# Patient Record
Sex: Male | Born: 1990 | Race: White | Hispanic: No | Marital: Married | Smoking: Never smoker
Health system: Southern US, Community
[De-identification: ages and names within clinical notes are randomized; demographics above are authoritative.]

## PROBLEM LIST (undated history)

## (undated) ENCOUNTER — Emergency Department (HOSPITAL_COMMUNITY): Admission: EM | Disposition: A | Discharge status: Home / Self Care | Payer: Self-pay

## (undated) DIAGNOSIS — N200 Calculus of kidney: Secondary | ICD-10-CM

## (undated) DIAGNOSIS — E079 Disorder of thyroid, unspecified: Secondary | ICD-10-CM

---
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
## 2009-08-17 ENCOUNTER — Emergency Department (HOSPITAL_COMMUNITY): Admission: EM | Admit: 2009-08-17 | Discharge: 2009-08-17 | Payer: Self-pay | Admitting: Family Medicine

## 2011-05-03 NOTE — Op Note (Signed)
[facility]  Patient:    Brian Chavez, Brian Chavez Visit Number: [id number] MRN: [id number]          Service Type: DSU Location: [facility] Attending Physician:  Carlos Levering Dictated by:   Hyman Bible Pendse, M.D. Proc. Date: 02/04/02 Admit Date:  02/04/2002 Discharge Date: 02/04/2002   CC:         Elvina Sidle, M.D.   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  POSTOPERATIVE DIAGNOSIS: 1. Left undescended testicle. 2. Left atrophic testicle, grossly normal in shape and consistency.  OPERATION PERFORMED:  Exploration of left groin and left orchiopexy.  SURGEON:  Prabhakar D. Levie Heritage, M.D.  ASSISTANT: Nelida Meuse, M.D.  ANESTHESIA:  Nurse.  OPERATIVE FINDINGS: Exploration of left groin showed partially descended left testicle located in the distal aspect of the left inguinal canal and by inspection appeared to be atrophic; however, normal in shape and consistency without any gross abnormalities to warrant biopsy of the testicle.  There was no evidence of hernia.  DESCRIPTION OF PROCEDURE:  Under satisfactory general anesthesia, patient in supine position, the abdomen and groin regions were thoroughly prepped and draped in the usual manner.  About 3 inch long transverse incision was made in the left groin and distal skin crease.  The skin and subcutaneous tissues were incised.  There was a large pad of fat in the left groin area.  The external abdominal ring identified.  External oblique opened.  The testicle was located in the distal aspect of the inguinal canal which was elevated.  Abnormal attachments were clamped, cut and electrocoagulated.  Testicle was now inspected.  The findings were as described above.  Spermatic cord structures were mobilized in order to perform the orchiopexy.  The spermatic cord structures were dissected.  There was no evidence of hernia  noted.  At this time the left inguinal tunnel as well as left scrotal subcutaneous pouch were created.  Testicle was brought through the inguinal canal into the left scrotal subcutaneous pouch.  It was fixed to the scrotal fascia with 4-0 Vicryl interrupted sutures.  Having placed the testicle into the left scrotal subcutaneous pouch, scrotal skin was closed with 5-0 chromic interrupted sutures.  The inguinal canal was irrigated.  Inguinal canal was repaired by modified Fergusons method with #35 wire interrupted sutures.  0.25% Marcaine with epinephrine was injected locally for postoperative analgesia. Subcutaneous tissues apposed with 4-0 Vicryl, skin closed with 4-0 Monocryl subcuticular sutures.  Steri-Strips applied.  Throughout the procedure, the patients vital signs remained stable.  The patient withstood the procedure well and was transferred to the recovery room in satisfactory general condition. Dictated by:   Hyman Bible Pendse, M.D. Attending Physician:  Carlos Levering DD:  02/04/02 TD:  02/04/02 Job: [id number]

## 2014-06-28 ENCOUNTER — Encounter (HOSPITAL_COMMUNITY): Payer: Self-pay | Admitting: Emergency Medicine

## 2014-06-28 ENCOUNTER — Emergency Department (HOSPITAL_COMMUNITY): Payer: BC Managed Care – PPO

## 2014-06-28 ENCOUNTER — Emergency Department (HOSPITAL_COMMUNITY)
Admission: EM | Admit: 2014-06-28 | Discharge: 2014-06-28 | Disposition: A | Discharge status: Home / Self Care | Payer: BC Managed Care – PPO | Attending: Emergency Medicine | Admitting: Emergency Medicine

## 2014-06-28 DIAGNOSIS — Y9389 Activity, other specified: Secondary | ICD-10-CM | POA: Insufficient documentation

## 2014-06-28 DIAGNOSIS — S1093XA Contusion of unspecified part of neck, initial encounter: Secondary | ICD-10-CM

## 2014-06-28 DIAGNOSIS — Y929 Unspecified place or not applicable: Secondary | ICD-10-CM | POA: Insufficient documentation

## 2014-06-28 DIAGNOSIS — W208XXA Other cause of strike by thrown, projected or falling object, initial encounter: Secondary | ICD-10-CM | POA: Insufficient documentation

## 2014-06-28 DIAGNOSIS — S0003XA Contusion of scalp, initial encounter: Secondary | ICD-10-CM | POA: Insufficient documentation

## 2014-06-28 DIAGNOSIS — R21 Rash and other nonspecific skin eruption: Secondary | ICD-10-CM | POA: Insufficient documentation

## 2014-06-28 DIAGNOSIS — S0083XA Contusion of other part of head, initial encounter: Secondary | ICD-10-CM | POA: Insufficient documentation

## 2014-06-28 DIAGNOSIS — S0990XA Unspecified injury of head, initial encounter: Secondary | ICD-10-CM | POA: Insufficient documentation

## 2014-06-28 MED ORDER — OXYCODONE-ACETAMINOPHEN 5-325 MG PO TABS
1.0000 | ORAL_TABLET | Freq: Once | ORAL | Status: AC
  Administered 2014-06-28: 1 via ORAL
  Filled 2014-06-28: qty 1

## 2014-06-28 MED ORDER — PERMETHRIN 5 % EX CREA: TOPICAL_CREAM | CUTANEOUS | Status: DC
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
## 2014-06-28 NOTE — Discharge Instructions (Signed)
Ibuprofen or tylenol for headache. Rest. Follow up with primary care doctor for recheck. Apply hydrocortisone cream to the rash. Use permethrin cream if not improving.   Head Injury You have received a head injury. It does not appear serious at this time. Headaches and vomiting are common following head injury. It should be easy to awaken from sleeping. Sometimes it is necessary for you to stay in the emergency department for a while for observation. Sometimes admission to the hospital may be needed. After injuries such as yours, most problems occur within the first 24 hours, but side effects may occur up to 7-10 days after the injury. It is important for you to carefully monitor your condition and contact your health care provider or seek immediate medical care if there is a change in your condition. WHAT ARE THE TYPES OF HEAD INJURIES? Head injuries can be as minor as a bump. Some head injuries can be more severe. More severe head injuries include:  A jarring injury to the brain (concussion).  A bruise of the brain (contusion). This mean there is bleeding in the brain that can cause swelling.  A cracked skull (skull fracture).  Bleeding in the brain that collects, clots, and forms a bump (hematoma). WHAT CAUSES A HEAD INJURY? A serious head injury is most likely to happen to someone who is in a car wreck and is not wearing a seat belt. Other causes of major head injuries include bicycle or motorcycle accidents, sports injuries, and falls. HOW ARE HEAD INJURIES DIAGNOSED? A complete history of the event leading to the injury and your current symptoms will be helpful in diagnosing head injuries. Many times, pictures of the brain, such as CT or MRI are needed to see the extent of the injury. Often, an overnight hospital stay is necessary for observation.  WHEN SHOULD I SEEK IMMEDIATE MEDICAL CARE?  You should get help right away if:  You have confusion or drowsiness.  You feel sick to your  stomach (nauseous) or have continued, forceful vomiting.  You have dizziness or unsteadiness that is getting worse.  You have severe, continued headaches not relieved by medicine. Only take over-the-counter or prescription medicines for pain, fever, or discomfort as directed by your health care provider.  You do not have normal function of the arms or legs or are unable to walk.  You notice changes in the black spots in the center of the colored part of your eye (pupil).  You have a clear or bloody fluid coming from your nose or ears.  You have a loss of vision. During the next 24 hours after the injury, you must stay with someone who can watch you for the warning signs. This person should contact local emergency services (911 in the [place] if you have seizures, you become unconscious, or you are unable to wake up. HOW CAN I PREVENT A HEAD INJURY IN THE FUTURE? The most important factor for preventing major head injuries is avoiding motor vehicle accidents. To minimize the potential for damage to your head, it is crucial to wear seat belts while riding in motor vehicles. Wearing helmets while bike riding and playing collision sports (like football) is also helpful. Also, avoiding dangerous activities around the house will further help reduce your risk of head injury.  WHEN CAN I RETURN TO NORMAL ACTIVITIES AND ATHLETICS? You should be reevaluated by your health care provider before returning to these activities. If you have any of the following symptoms, you should not return  to activities or contact sports until 1 week after the symptoms have stopped:  Persistent headache.  Dizziness or vertigo.  Poor attention and concentration.  Confusion.  Memory problems.  Nausea or vomiting.  Fatigue or tire easily.  Irritability.  Intolerant of bright lights or loud noises.  Anxiety or depression.  Disturbed sleep. MAKE SURE YOU:   Understand these instructions.  Will watch your  condition.  Will get help right away if you are not doing well or get worse. Document Released: 12/02/2005 Document Revised: 12/07/2013 Document Reviewed: 08/09/2013 [facility] Patient Information 2015 [place], [place]. This information is not intended to replace advice given to you by your health care provider. Make sure you discuss any questions you have with your health care provider.

## 2014-06-28 NOTE — ED Notes (Signed)
Patient returned from CT
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
## 2014-06-30 NOTE — ED Provider Notes (Signed)
Medical screening examination/treatment/procedure(s) were performed by non-physician practitioner and as supervising physician I was immediately available for consultation/collaboration.   EKG Interpretation None       Raimundo Corbit, MD 06/30/14 0726 

## 2018-08-11 ENCOUNTER — Ambulatory Visit (HOSPITAL_COMMUNITY)
Admission: EM | Admit: 2018-08-11 | Discharge: 2018-08-11 | Disposition: A | Discharge status: Home / Self Care | Payer: BLUE CROSS/BLUE SHIELD | Attending: Family Medicine | Admitting: Family Medicine

## 2018-08-11 ENCOUNTER — Encounter (HOSPITAL_COMMUNITY): Payer: Self-pay

## 2018-08-11 DIAGNOSIS — Z87442 Personal history of urinary calculi: Secondary | ICD-10-CM | POA: Insufficient documentation

## 2018-08-11 DIAGNOSIS — R1011 Right upper quadrant pain: Secondary | ICD-10-CM | POA: Diagnosis present

## 2018-08-11 HISTORY — DX: Calculus of kidney: N20.0

## 2018-08-11 LAB — CBC
HCT: 47.2 % (ref 39.0–52.0)
Hemoglobin: 15.4 g/dL (ref 13.0–17.0)
MCH: 28.5 pg (ref 26.0–34.0)
MCHC: 32.6 g/dL (ref 30.0–36.0)
MCV: 87.2 fL (ref 78.0–100.0)
Platelets: 192 10*3/uL (ref 150–400)
RBC: 5.41 MIL/uL (ref 4.22–5.81)
RDW: 12.4 % (ref 11.5–15.5)
WBC: 9.4 10*3/uL (ref 4.0–10.5)

## 2018-08-11 LAB — COMPREHENSIVE METABOLIC PANEL
ALT: 31 U/L (ref 0–44)
AST: 20 U/L (ref 15–41)
Albumin: 4.3 g/dL (ref 3.5–5.0)
Alkaline Phosphatase: 35 U/L — ABNORMAL LOW (ref 38–126)
Anion gap: 11 (ref 5–15)
BUN: 11 mg/dL (ref 6–20)
CO2: 24 mmol/L (ref 22–32)
Calcium: 9.2 mg/dL (ref 8.9–10.3)
Chloride: 103 mmol/L (ref 98–111)
Creatinine, Ser: 0.91 mg/dL (ref 0.61–1.24)
GFR calc Af Amer: >60 mL/min (ref >60)
GFR calc non Af Amer: >60 mL/min (ref >60)
Glucose, Bld: 97 mg/dL (ref 70–99)
Potassium: 4 mmol/L (ref 3.5–5.1)
Sodium: 138 mmol/L (ref 135–145)
Total Bilirubin: 1.3 mg/dL — ABNORMAL HIGH (ref 0.3–1.2)
Total Protein: 7.5 g/dL (ref 6.5–8.1)

## 2018-08-11 LAB — LIPASE, BLOOD: Lipase: 34 U/L (ref 11–51)

## 2018-08-11 LAB — AMYLASE: Amylase: 17 U/L — ABNORMAL LOW (ref 28–100)

## 2018-08-11 NOTE — Discharge Instructions (Signed)
You have been seen today for abdominal pain. Your evaluation was not suggestive of any emergent condition requiring medical intervention at this time. However, some abdominal problems make take more time to appear. Therefore, it is important for you to watch for any new symptoms or worsening of your current condition. Please proceed to the Emergency Department immediately should you feel worse in any way or have any of the following symptoms: increasing or different abdominal pain, persistent vomiting, fevers, or shaking chills.  ° ° °

## 2018-08-11 NOTE — ED Triage Notes (Signed)
Pt presents with abdominal pain that is just above belly button that sometimes radiates around to right lower quadrant. Pt states the pain makes him nauseas.

## 2018-08-12 NOTE — ED Provider Notes (Signed)
[facility]   [id number] 08/11/18 Arrival Time: 1129  ASSESSMENT & PLAN:  1. Right upper quadrant abdominal pain    Still within the first 24 hours. Non-specific. Pain free here this evening. No significant lab abnormalities.   Discharge Instructions     You have been seen today for abdominal pain. Your evaluation was not suggestive of any emergent condition requiring medical intervention at this time. However, some abdominal problems make take more time to appear. Therefore, it is important for you to watch for any new symptoms or worsening of your current condition. Please proceed to the Emergency Department immediately should you feel worse in any way or have any of the following symptoms: increasing or different abdominal pain, persistent vomiting, fevers, or shaking chills.    Close observation.  Follow-up Information    [facility].   Specialty:  Emergency Medicine Why:  If symptoms worsen. Contact information: [address] [id number] [place] [id number] [phone]         May call his PCP to set up appt +/- RUQ U/S.  May f/u here as needed.  Reviewed expectations re: course of current medical issues. Questions answered. Outlined signs and symptoms indicating need for more acute intervention. Patient verbalized understanding. After Visit Summary given.   SUBJECTIVE:  Brian Chavez is a 27 y.o. male who presents with complaint of intermittent abdominal discomfort. Onset abrupt, yesterday. Location: midline upper abdomen and RUQ without radiation. Described as dull. Symptoms are intermittent since beginning. Typical episode with abrupt onset; lasts a few seconds; gradually resolves. Aggravating factors: none. Alleviating factors: none. Associated symptoms: none. He denies anorexia, belching, chills, constipation, diarrhea, fever, nausea, sweats and vomiting. No flank pain or hematuria or  dysuria. Appetite: normal. PO intake: normal. Ambulatory without assistance. Last bowel movement yesterday (he thinks) without blood. OTC treatment: none. Is passing gas from rectum "but not as much as usual".  History reviewed. No pertinent surgical history.  ROS: As per HPI. All other systems negative.  OBJECTIVE:  Vitals:   08/11/18 1149  BP: (!) 146/85  Pulse: 72  Resp: 20  Temp: 97.7 F (36.5 C)  TempSrc: Oral  SpO2: 96%    General appearance: alert; no distress Lungs: clear to auscultation bilaterally Heart: regular rate and rhythm Abdomen: soft; non-distended; no tenderness; bowel sounds present; no masses or organomegaly; no guarding or rebound tenderness Back: no CVA tenderness; FROM at hips Extremities: no edema; symmetrical with no gross deformities Skin: warm and dry Neurologic: normal gait Psychological: alert and cooperative; normal mood and affect  Labs: Results for orders placed or performed during the hospital encounter of 08/11/18  CBC  Result Value Ref Range   WBC 9.4 4.0 - 10.5 K/uL   RBC 5.41 4.22 - 5.81 MIL/uL   Hemoglobin 15.4 13.0 - 17.0 g/dL   HCT 64.447.2 03.439.0 - 74.252.0 %   MCV 87.2 78.0 - 100.0 fL   MCH 28.5 26.0 - 34.0 pg   MCHC 32.6 30.0 - 36.0 g/dL   RDW 59.512.4 63.811.5 - 75.615.5 %   Platelets 192 150 - 400 K/uL  Comprehensive metabolic panel  Result Value Ref Range   Sodium 138 135 - 145 mmol/L   Potassium 4.0 3.5 - 5.1 mmol/L   Chloride 103 98 - 111 mmol/L   CO2 24 22 - 32 mmol/L   Glucose, Bld 97 70 - 99 mg/dL   BUN 11 6 - 20 mg/dL   Creatinine, Ser 4.330.91  0.61 - 1.24 mg/dL   Calcium 9.2 8.9 - 40.9 mg/dL   Total Protein 7.5 6.5 - 8.1 g/dL   Albumin 4.3 3.5 - 5.0 g/dL   AST 20 15 - 41 U/L   ALT 31 0 - 44 U/L   Alkaline Phosphatase 35 (L) 38 - 126 U/L   Total Bilirubin 1.3 (H) 0.3 - 1.2 mg/dL   GFR calc non Af Amer >60 >60 mL/min   GFR calc Af Amer >60 >60 mL/min   Anion gap 11 5 - 15  Amylase  Result Value Ref Range   Amylase 17 (L) 28 -  100 U/L  Lipase, blood  Result Value Ref Range   Lipase 34 11 - 51 U/L     No Known Allergies                                             Past Medical History:  Diagnosis Date  . Kidney stones    Social History   Socioeconomic History  . Marital status: Married    Spouse name: Not on file  . Number of children: Not on file  . Years of education: Not on file  . Highest education level: Not on file  Occupational History  . Not on file  Social Needs  . Financial resource strain: Not on file  . Food insecurity:    Worry: Not on file    Inability: Not on file  . Transportation needs:    Medical: Not on file    Non-medical: Not on file  Tobacco Use  . Smoking status: Never Smoker  . Smokeless tobacco: Current User    Types: Chew  Substance and Sexual Activity  . Alcohol use: Yes    Comment: occasional   . Drug use: No  . Sexual activity: Not on file  Lifestyle  . Physical activity:    Days per week: Not on file    Minutes per session: Not on file  . Stress: Not on file  Relationships  . Social connections:    Talks on phone: Not on file    Gets together: Not on file    Attends religious service: Not on file    Active member of club or organization: Not on file    Attends meetings of clubs or organizations: Not on file    Relationship status: Not on file  . Intimate partner violence:    Fear of current or ex partner: Not on file    Emotionally abused: Not on file    Physically abused: Not on file    Forced sexual activity: Not on file  Other Topics Concern  . Not on file  Social History Narrative  . Not on file   FH: No h/o colon disease reported.   Mardella Layman, MD 08/12/18 [phone]

## 2021-05-01 ENCOUNTER — Emergency Department (HOSPITAL_COMMUNITY): Payer: 59

## 2021-05-01 ENCOUNTER — Other Ambulatory Visit: Payer: Self-pay

## 2021-05-01 ENCOUNTER — Emergency Department (HOSPITAL_COMMUNITY)
Admission: EM | Admit: 2021-05-01 | Discharge: 2021-05-01 | Disposition: A | Discharge status: Home / Self Care | Payer: 59 | Attending: Emergency Medicine | Admitting: Emergency Medicine

## 2021-05-01 ENCOUNTER — Encounter (HOSPITAL_COMMUNITY): Payer: Self-pay | Admitting: *Deleted

## 2021-05-01 DIAGNOSIS — M79662 Pain in left lower leg: Secondary | ICD-10-CM | POA: Diagnosis not present

## 2021-05-01 DIAGNOSIS — F17200 Nicotine dependence, unspecified, uncomplicated: Secondary | ICD-10-CM | POA: Insufficient documentation

## 2021-05-01 DIAGNOSIS — M79605 Pain in left leg: Secondary | ICD-10-CM | POA: Diagnosis present

## 2021-05-01 NOTE — ED Triage Notes (Signed)
Knot on left lower leg for over 2 years, referred here for ultrasound

## 2021-05-01 NOTE — Discharge Instructions (Signed)
I recommend a combination of tylenol and ibuprofen for management of your pain. You can take a low dose of both at the same time. I recommend 500 mg of Tylenol combined with 600 mg of ibuprofen. This is one maximum strength Tylenol and three regular ibuprofen. You can take these 2-3 times for day for your pain. Please try to take these medications with a small amount of food as well to prevent upsetting your stomach.  Also, please consider topical pain relieving creams such as Voltaran Gel, BioFreeze, or Icy Hot. There is also a pain relieving cream made by [employer]. You should be able to find all of these at your local pharmacy.   Below is a referral to Dr. Dallas Schimke with orthopedics.  Please give them a call in 1 to 2 weeks if you find your symptoms are still not improved.  If you develop any new or worsening symptoms, please return to the emergency department for reevaluation.  It was a pleasure to meet you.

## 2021-05-01 NOTE — ED Provider Notes (Signed)
[facility] EMERGENCY DEPARTMENT Provider Note   CSN: [id number] Arrival date & time: 05/01/21  1127     History Chief Complaint  Patient presents with  . Leg Pain    Brian Chavez is a 30 y.o. male.  HPI Patient is a 30 year old male who presents to the emergency department due to atraumatic intermittent pain along the left shin for the past 2 years.  Patient states that his pain worsens with ambulation and resolves with sitting still.  Denies any numbness or weakness.  States that he was seen in urgent care earlier today and told to come to the emergency department for DVT rule out.  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    Past Medical History:  Diagnosis Date  . Kidney stones     There are no problems to display for this patient.   History reviewed. No pertinent surgical history.     No family history on file.  Social History   Tobacco Use  . Smoking status: Never Smoker  . Smokeless tobacco: Current User    Types: Chew  Substance Use Topics  . Alcohol use: Yes    Comment: occasional   . Drug use: No    Home Medications Prior to Admission medications   Medication Sig Start Date End Date Taking? Authorizing Provider  ibuprofen (ADVIL,MOTRIN) 200 MG tablet Take 800 mg by mouth daily as needed for headache.    [provider]  permethrin (ELIMITE) 5 % cream Apply to affected area once. Repeat in one week 06/28/14   Jaynie Crumble, PA-C    Allergies    Patient has no known allergies.  Review of Systems   Review of Systems  Cardiovascular: Negative for leg swelling.  Musculoskeletal: Positive for myalgias.  Skin: Negative for color change and wound.  Neurological: Negative for weakness and numbness.   Physical Exam Updated Vital Signs BP 139/82   Pulse (!) 57   Temp 97.6 F (36.4 C) (Oral)   Resp 18   Ht 6\' 2"  (1.88 m)   Wt (!) 161.9 kg   SpO2 94%   BMI 45.84 kg/m    Physical Exam Vitals and nursing note reviewed.  Constitutional:      General: He is not in acute distress.    Appearance: He is well-developed.  HENT:     Head: Normocephalic and atraumatic.     Right Ear: External ear normal.     Left Ear: External ear normal.  Eyes:     General: No scleral icterus.       Right eye: No discharge.        Left eye: No discharge.     Conjunctiva/sclera: Conjunctivae normal.  Neck:     Trachea: No tracheal deviation.  Cardiovascular:     Rate and Rhythm: Normal rate.  Pulmonary:     Effort: Pulmonary effort is normal. No respiratory distress.     Breath sounds: No stridor.  Abdominal:     General: There is no distension.  Musculoskeletal:        General: Tenderness present. No swelling or deformity.     Cervical back: Neck supple.     Comments: Moderate TTP noted along the midshaft of the left tibia.  No tenderness appreciated in the left knee or left ankle.  Full range of motion of the left knee and left ankle.  No leg swelling noted.  No calf tenderness.  2+  DP pulses.  Distal sensation intact.  Skin:    General: Skin is warm and dry.     Findings: No rash.  Neurological:     Mental Status: He is alert.     Cranial Nerves: Cranial nerve deficit: no gross deficits.    ED Results / Procedures / Treatments   Labs (all labs ordered are listed, but only abnormal results are displayed) Labs Reviewed - No data to display  EKG None  Radiology DG Tibia/Fibula Left  Result Date: 05/01/2021 CLINICAL DATA:  Swelling and severe pain in the left tibia. EXAM: LEFT TIBIA AND FIBULA - 2 VIEW COMPARISON:  None. FINDINGS: Left tibia and fibula are intact. Normal alignment at the left knee and ankle. Small calcification along the anterior aspect of the ankle joint is nonspecific and could be chronic. IMPRESSION: 1. No acute abnormality to left lower leg. 2. Small calcification or ossification along the anterior ankle joint is nonspecific. Suspect this  is an incidental finding but recommend clinical correlation in this area. If this is the area of concern, recommend dedicated ankle imaging. Electronically Signed   By: Richarda Overlie M.D.   On: 05/01/2021 13:43   US Venous Img Lower Unilateral Left  Result Date: 05/01/2021 CLINICAL DATA:  Swelling and severe pain LEFT tibia EXAM: LEFT LOWER EXTREMITY VENOUS DOPPLER ULTRASOUND TECHNIQUE: Gray-scale sonography with compression, as well as color and duplex ultrasound, were performed to evaluate the deep venous system(s) from the level of the common femoral vein through the popliteal and proximal calf veins. COMPARISON:  None. FINDINGS: VENOUS Normal compressibility of the common femoral, superficial femoral, and popliteal veins, as well as the visualized calf veins. Visualized portions of profunda femoral vein and great saphenous vein unremarkable. No filling defects to suggest DVT on grayscale or color Doppler imaging. Doppler waveforms show normal direction of venous flow, normal respiratory plasticity and response to augmentation. Limited views of the contralateral common femoral vein are unremarkable. OTHER None. Limitations: none IMPRESSION: No evidence of deep venous thrombosis. Electronically Signed   By: Genevive Bi M.D.   On: 05/01/2021 13:19   Procedures Procedures   Medications Ordered in ED Medications - No data to display  ED Course  I have reviewed the triage vital signs and the nursing notes.  Pertinent labs & imaging results that were available during my care of the patient were reviewed by me and considered in my medical decision making (see chart for details).    MDM Rules/Calculators/A&P                          Patient is a 30 year old male who presents to the emergency department due to atraumatic intermittent pain to the left shin that has been occurring for the past 2 years.  Obtained x-rays of the region as well as an ultrasound to rule out DVT.  Ultrasound was negative  for DVT.  X-rays were also negative.  Unsure of the source of his symptoms.  Likely an overuse injury.  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  Denies any trauma to the region.  He is neurovascularly intact in the leg.  No red flags.  Recommended use of Tylenol and ibuprofen for management of his pain.  We discussed dosing.  Also discussed over-the-counter anti-inflammatory creams.  We will give him a referral to Dr. Dallas Schimke with orthopedics if he finds that his symptoms are  refractory.  We discussed return precautions.  His questions were answered and he was amicable to time of discharge.  Final Clinical Impression(s) / ED Diagnoses Final diagnoses:  Pain in left shin    Rx / DC Orders ED Discharge Orders    None       Placido Sou, PA-C 05/01/21 1521    Terrilee Files, MD 05/01/21 1821

## 2021-05-01 NOTE — ED Provider Notes (Signed)
Emergency Medicine Provider Triage Evaluation Note  Brian Chavez , a 30 y.o. male  was evaluated in triage.  Pt complains of atraumatic intermittent pain to the left tibia for the past 2 years.  Severely worsens with ambulation and resolves when sitting.  Point of pain and swelling to the left tibia.  No numbness or weakness.  Seen in urgent care earlier today and told to come to the emergency department for an ultrasound.  Physical Exam  BP 139/82   Pulse (!) 57   Temp 97.6 F (36.4 C) (Oral)   Resp 18   Ht 6\' 2"  (1.88 m)   Wt (!) 161.9 kg   SpO2 94%   BMI 45.84 kg/m  Gen:   Awake, no distress   Resp:  Normal effort  MSK:   Moves extremities without difficulty  Other:    Medical Decision Making  Medically screening exam initiated at 12:36 PM.  Appropriate orders placed.  was informed that the remainder of the evaluation will be completed by another provider, this initial triage assessment does not replace that evaluation, and the importance of remaining in the ED until their evaluation is complete.    Hewitt Blade, PA-C 05/01/21 1237    05/03/21, MD 05/01/21 [phone]

## 2021-05-08 ENCOUNTER — Ambulatory Visit: Payer: 59 | Admitting: Orthopedic Surgery

## 2021-05-08 ENCOUNTER — Encounter: Payer: Self-pay | Admitting: Orthopedic Surgery

## 2021-05-08 ENCOUNTER — Other Ambulatory Visit: Payer: Self-pay

## 2021-05-08 ENCOUNTER — Ambulatory Visit (INDEPENDENT_AMBULATORY_CARE_PROVIDER_SITE_OTHER): Payer: 59 | Admitting: Orthopedic Surgery

## 2021-05-08 VITALS — BP 140/88 | HR 64 | Ht 74.0 in | Wt 355.6 lb

## 2021-05-08 DIAGNOSIS — M898X6 Other specified disorders of bone, lower leg: Secondary | ICD-10-CM | POA: Diagnosis not present

## 2021-05-08 DIAGNOSIS — Z6841 Body Mass Index (BMI) 40.0 and over, adult: Secondary | ICD-10-CM

## 2021-05-08 NOTE — Patient Instructions (Signed)
While we are working on your approval please go ahead and call to schedule your appointment with [facility] in at least one (1) week.  ° °Central Scheduling °[phone] °

## 2021-05-08 NOTE — Progress Notes (Addendum)
New Patient Visit  Assessment: Brian Chavez is a 30 y.o. male with the following: Left tibia pain; possible stress fracture without obvious injury on radiographs  Plan: Reviewed radiographs with the patient in clinic today.  He has had pain in the left tibia only for the past 2 years.  This is associated with some swelling.  On physical exam, there is a small swelling which is very tender to palpation, as is the shaft of the tibia.  There are no overlying skin changes.  His health is remained stable.  No previous injury to his left tibia or shin area.  There has been no change in his activity, which is often seen with a stress fracture but it is possible given the tenderness exhibited on palpation.  It is not clear what is going on at this time, and we will plan to obtain an MRI to further evaluate the soft tissue and tibial shaft in this area.  Once the MRI is complete, we will need to discuss the findings.  All questions were answered and he is amenable to this plan.  The patient meets the AMA guidelines for Morbid obesity with BMI > 40.  The patient has been counseled on weight loss.     Follow-up: Return for After MRI.  Subjective:  Chief Complaint  Patient presents with  . New Patient (Initial Visit)    Lt shin pain    History of Present Illness: Brian Chavez is a 30 y.o. male who presents for evaluation of his left lower leg pain.  He states he has had pain in the midshaft area of the left tibia for the past 2 years.  Occasionally the pain will be severe.  When it is most severe, he notes a "knot" forming in this area.  The knot itself is very tender to palpation.  Occasionally, he will wake up with pain in this area.  When it is painful to touch, it is painful when he walks.  The pain does not worsen when he ambulates.  His health has remained stable.  He denies night pains.  He has no night sweats.  He will take ibuprofen as needed.  No numbness or tingling.  X-rays and an  ultrasound were obtained in the emergency department.  X-ray without evidence of an acute injury.  Ultrasound was negative for a DVT.   Review of Systems: No fevers or chills No numbness or tingling No chest pain No shortness of breath No bowel or bladder dysfunction No GI distress No headaches   Medical History:  Past Medical History:  Diagnosis Date  . Kidney stones     History reviewed. No pertinent surgical history.  History reviewed. No pertinent family history. Social History   Tobacco Use  . Smoking status: Never Smoker  . Smokeless tobacco: Current User    Types: Chew  Substance Use Topics  . Alcohol use: Yes    Comment: occasional   . Drug use: No    No Known Allergies  No outpatient medications have been marked as taking for the 05/08/21 encounter (Office Visit) with Oliver Barre, MD.    Objective: BP 140/88   Pulse 64   Ht 6\' 2"  (1.88 m)   Wt (!) 355 lb 9.6 oz (161.3 kg)   BMI 45.66 kg/m   Physical Exam:  General: Alert and oriented.  No acute distress.  Obese male. Gait: Normal   Evaluation of the left leg demonstrates no obvious deformity.  No  atrophy is appreciated.  There is no redness or induration at the site of maximal tenderness.  There is a small raised bump in the mid shaft area.  This is very tender to palpation.  It is soft and compressible.  It is not matted to the skin.  The tibial shaft in this area is also very tender to palpation.  No tenderness to palpation along the medial lateral joint line.  Full range of motion of the knee without pain.  Active motion intact in the left foot.  Sensation is intact over the dorsum of the foot.  2+ DP pulse.   IMAGING: I personally reviewed images previously obtained from the ED   X-ray of the left tibia is without acute injuries.  There is no obvious cortical thickening.  No nidus is appreciated.  There is no black line indicative of an impending fracture.   New Medications:  No orders of  the defined types were placed in this encounter.     Oliver Barre, MD  05/08/2021 3:50 PM

## 2021-05-23 ENCOUNTER — Other Ambulatory Visit: Payer: Self-pay

## 2021-05-23 ENCOUNTER — Ambulatory Visit (HOSPITAL_COMMUNITY)
Admission: RE | Admit: 2021-05-23 | Discharge: 2021-05-23 | Disposition: A | Discharge status: Home / Self Care | Payer: 59 | Source: Ambulatory Visit | Attending: Orthopedic Surgery | Admitting: Orthopedic Surgery

## 2021-05-23 DIAGNOSIS — M898X6 Other specified disorders of bone, lower leg: Secondary | ICD-10-CM | POA: Diagnosis present

## 2021-05-25 ENCOUNTER — Other Ambulatory Visit: Payer: Self-pay

## 2021-05-25 ENCOUNTER — Ambulatory Visit (INDEPENDENT_AMBULATORY_CARE_PROVIDER_SITE_OTHER): Payer: 59 | Admitting: Orthopedic Surgery

## 2021-05-25 ENCOUNTER — Encounter: Payer: Self-pay | Admitting: Orthopedic Surgery

## 2021-05-25 VITALS — BP 149/104 | HR 60 | Ht 74.0 in | Wt 362.0 lb

## 2021-05-25 DIAGNOSIS — M898X6 Other specified disorders of bone, lower leg: Secondary | ICD-10-CM | POA: Diagnosis not present

## 2021-05-25 NOTE — Progress Notes (Signed)
Orthopaedic Clinic Return  Assessment: Brian Chavez is a 30 y.o. male with the following: Left tibia pain; MRI without concerning findings.   Plan: Reviewed the MRI with the patient in clinic today, and the radiologist did not comment on any concerning features.  After evaluating the patient his leg, I reviewed the MRI again, and there does appear to be a very small lesion that is apparent on the MRI in both T1 and T2 sequences.  This corresponds with the area of tenderness.  At this point, I am not certain what is causing his pain.  Nonetheless, I plan to discuss this with the radiologist to see if they have any further input.  I will also discussed this with some colleagues to see if we can determine what might be causing his pain.  All of his questions were answered and he is in agreement with this plan.  I plan to touch base with him within the next 1-2 weeks, to discuss this in more detail.  Body mass index is 46.48 kg/m.  Follow-up: Return if symptoms worsen or fail to improve.   Subjective:  Chief Complaint  Patient presents with   Results    Review MRI left leg painful     History of Present Illness: Brian Chavez is a 30 y.o. male who returns to clinic today for repeat evaluation of his left tibia.  At the last clinic visit, we referred him for an MRI.  This imaging study has been completed, and he returns today to discuss the findings.  He continues to have some intermittent pain in the anterior aspect of his left tibia.  Currently, it is very painful.  The pain is not associated with activity.  He states that he was working in the yard yesterday with his father, and that is why his pain is severe right now.  Review of Systems: No fevers or chills No numbness or tingling No chest pain No shortness of breath No bowel or bladder dysfunction No GI distress No headaches   Objective: BP (!) 149/104   Pulse 60   Ht 6\' 2"  (1.88 m)   Wt (!) 362 lb (164.2 kg)   BMI  46.48 kg/m   Physical Exam:  Obese male.  No acute distress.  Normal gait.  Evaluation of the left lower leg demonstrates no swelling.  No overlying skin changes.  There are no areas of erythema.  No induration.  There is a small, less than 1 cm bump on the anterior tibia.  This is very tender today.  It is not matted to the skin.  There is no surrounding swelling.  No numbness or tingling.  Sensation is intact distally.  IMAGING: I personally ordered and reviewed the following images:  MRI of the left tibia demonstrates no bony lesion.  There is no edema within the bone.  No concern for stress fracture.  There is a small, round lesion which is within the subcutaneous tissue anterior to the tibia which is apparent on both T1 and T2 sequences.  , MD 05/25/2021 11:40 PM

## 2021-06-01 ENCOUNTER — Telehealth: Payer: Self-pay | Admitting: Orthopedic Surgery

## 2021-06-01 NOTE — Telephone Encounter (Signed)
Follow up for clinic visit 05/25/21  Patient was seen in clinic approximately 1 week ago, for review of his left tibia MRI.  At that visit, he continued to have pain over a small nodule in the anterior aspect of his tibia.  This was not commented on in the MRI.  I explicitly told him I was unsure what this was on the MRI, but I plan to discuss the results with both the radiologist, and a colleague at [facility].  I contacted him in follow-up, to discuss this in more detail.  He continues to have pain in the same area, directly overlying the small nodule.  He states he [place] impacted this area of the tibia.  He does not remember a laceration, but if he did have one, it was not in this direct area.  Nonetheless, the pain is causing him difficulty when he ambulates.  I explained to him my discussion with the radiologist, stating that the area is showing up in the MRI could be a benign nerve sheath tumor versus a vascular malformation.  I also discussed this with one of my colleagues at [facility], and he stated that the findings were nonspecific, and small, and this could easily be removed.   All this information was provided to the patient.  He stated he would like to discuss this as a possibility with his wife.  I have advised him to discuss it with family, and to make arrangements at work, if he wishes to have this nodule removed.  He will contact us if you wish to proceed.  I briefly discussed the procedure with him, which would be an outpatient procedure.  It would involve a small incision, and sending the nodule to pathology for further analysis.  I explicitly told him that I cannot guarantee that removing this nodule will improve his pain 100%, but I do think it can improve his pain.  He stated his understanding.  It is possible, that the pathology could also be something unexpected.  He may require something in the future.  All his questions were  answered, and he will let us know how they wish to proceed.  No follow-up is scheduled at this time, but we are available if he has any issues in the future.   Amaryllis Malmquist A. Dallas Schimke, MD MS [facility] [address] [place],  [place]  [address] Phone: [phone] Fax: [phone]

## 2021-08-18 IMAGING — MR MR [PERSON_NAME] LOW W/O CM*L*
6 series · 40 of 40 positions shown · non-contrast
Comparison: None.
COMPARISON: None.

Addendum:
CLINICAL DATA: Anterior left shin pain.

EXAM:
MRI OF LOWER LEFT EXTREMITY WITHOUT CONTRAST
TECHNIQUE: Multiplanar, multisequence MR imaging of the left lower leg was
performed. No intravenous contrast was administered.

[Series 4: T2 · sagittal · left · 5.0mm · 1.15mm/px · 7 of 35 slices shown]
[im 1/35]
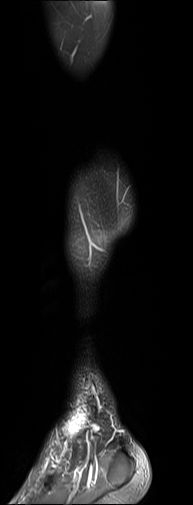
[im 6/35]
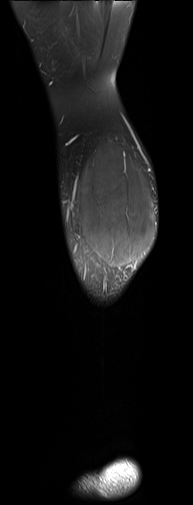
[im 12/35]
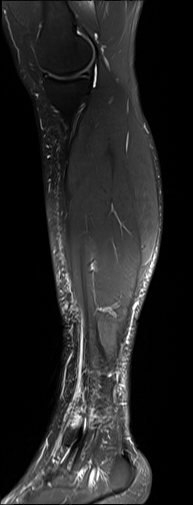
[im 18/35]
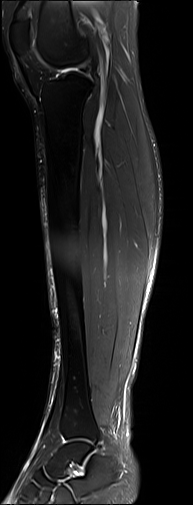
[im 23/35]
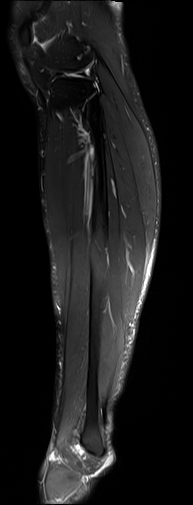
[im 29/35]
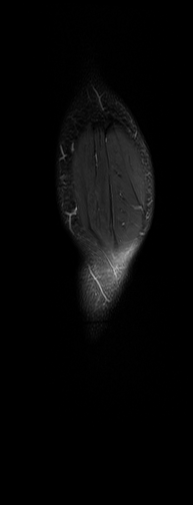
[im 35/35]
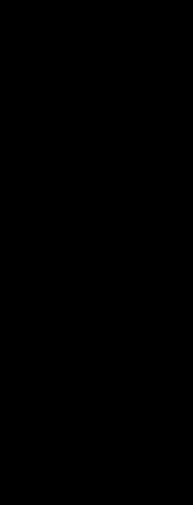

[Series 5: T1 · coronal · left · 4.0mm · 0.65mm/px · 6 of 29 slices shown (1 of 2)]
[im 1/29]
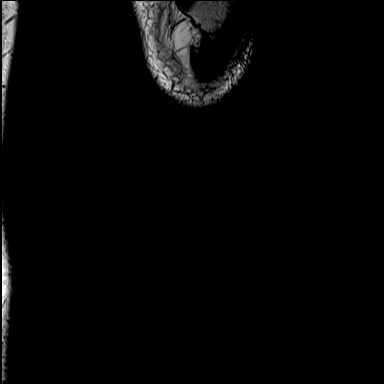
[im 6/29]
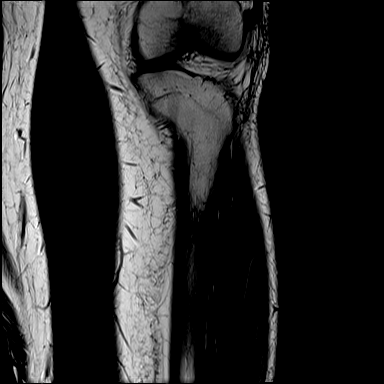
[im 12/29]
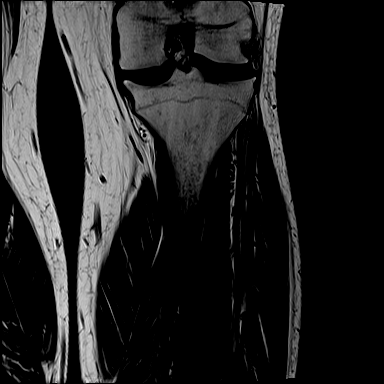
[im 17/29]
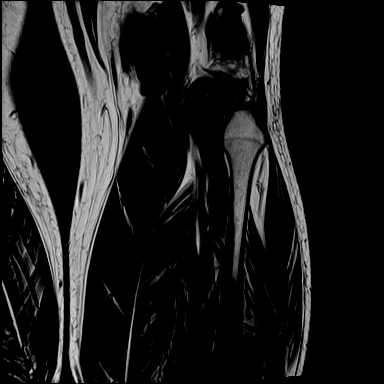
[im 23/29]
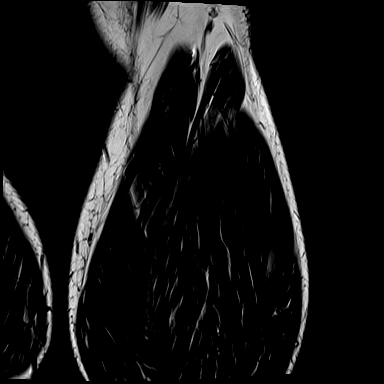
[im 29/29]
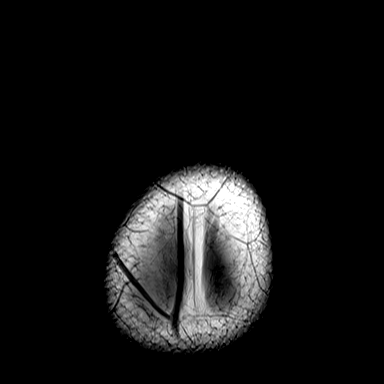

[Series 6: STIR · coronal · left · 4.0mm · 0.71mm/px · 6 of 29 slices shown (1 of 2)]
[im 1/29]
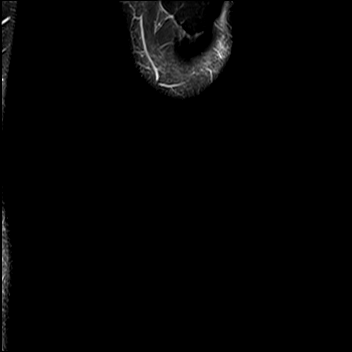
[im 6/29]
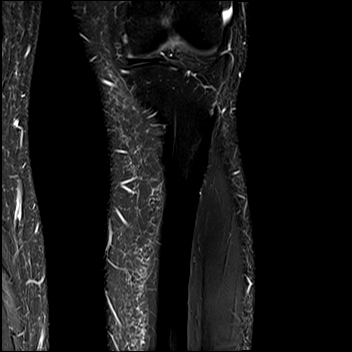
[im 12/29]
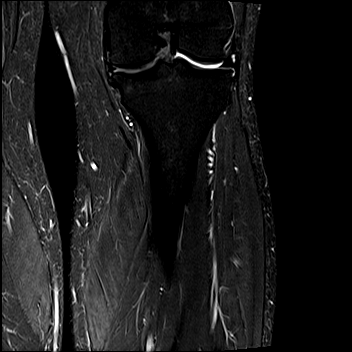
[im 17/29]
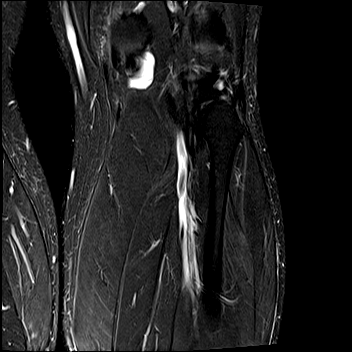
[im 23/29]
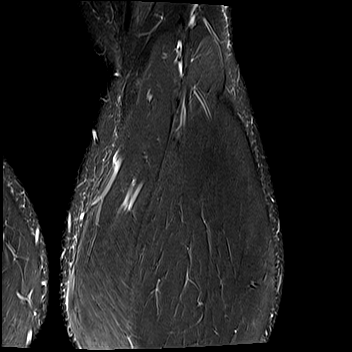
[im 29/29]
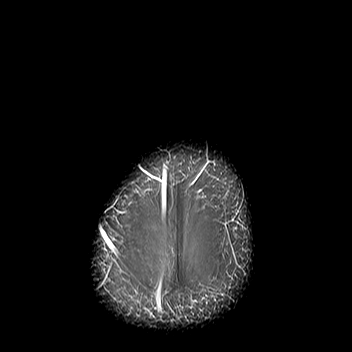

[Series 7: T1 · axial · left · 4.0mm · 0.78mm/px · z∈[-9,+186]mm · 8 of 40 slices shown (2 of 2)]
[im 1/40]
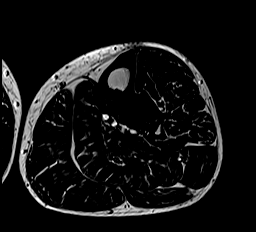
[im 6/40]
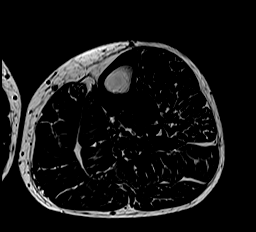
[im 12/40]
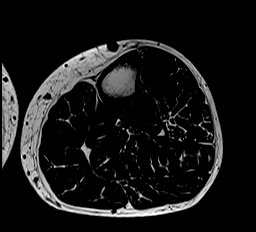
[im 17/40]
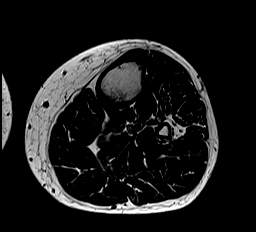
[im 23/40]
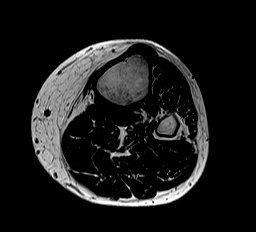
[im 28/40]
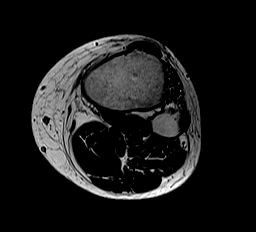
[im 34/40]
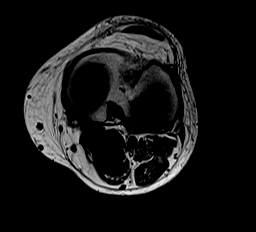
[im 40/40]
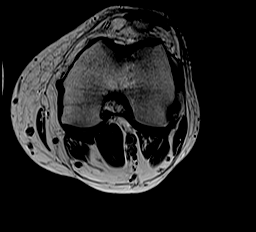

[Series 8: T2 fat-sat · axial · left · 4.0mm · 0.78mm/px · z∈[-9,+186]mm · 8 of 40 slices shown]
[im 1/40]
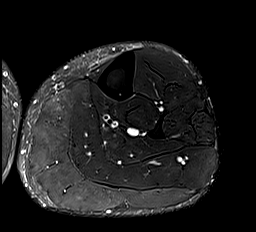
[im 6/40]
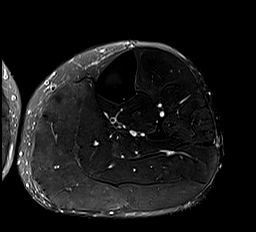
[im 12/40]
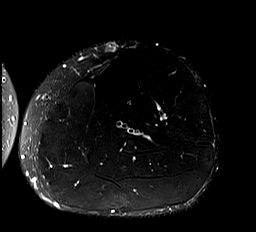
[im 17/40]
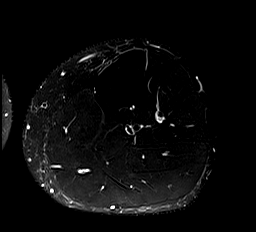
[im 23/40]
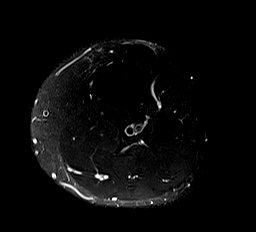
[im 28/40]
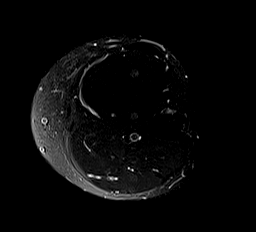
[im 34/40]
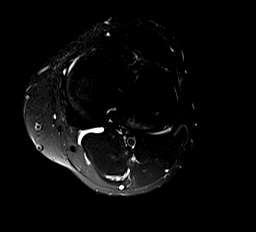
[im 40/40]
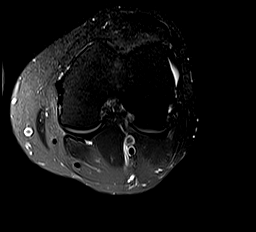

[Series 9: STIR · sagittal · left · 4.0mm · 0.70mm/px · 5 of 24 slices shown (2 of 2)]
[im 1/24]
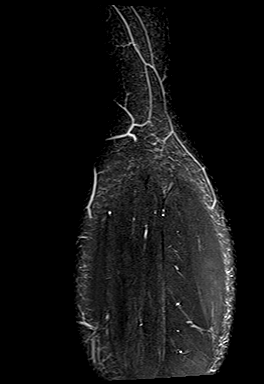
[im 6/24]
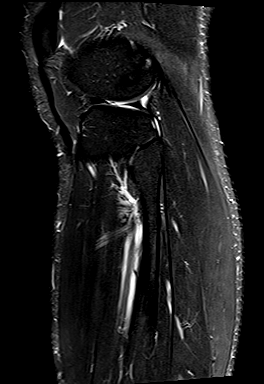
[im 12/24]
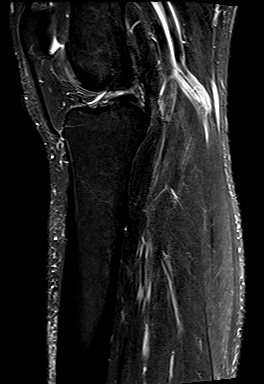
[im 18/24]
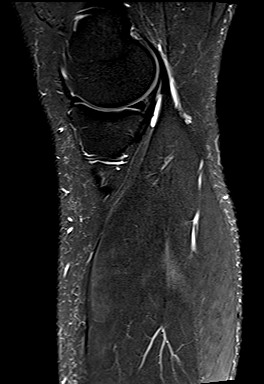
[im 24/24]
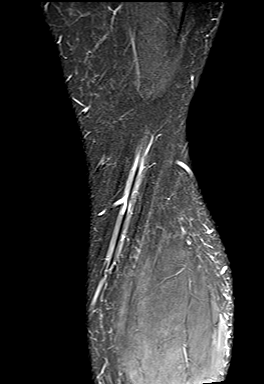

[40 of 40 positions shown; findings below may reference images not displayed]

FINDINGS: Bones/Joint/Cartilage

No marrow signal abnormality. No fracture or dislocation. Normal
alignment. No joint effusion. Small Baker's cyst.

Incidental note made of lateral meniscal flounce. Attenuation of the
free edge of the body of the medial meniscus likely reflecting a
tiny radial tear.

Ligaments

Collateral ligaments are intact.

Muscles and Tendons
Muscles are normal.

Soft tissue
No fluid collection or hematoma. No soft tissue mass.
IMPRESSION: 1. No acute injury of the left lower leg. No evidence of stress
reaction.
2. Attenuation of the free edge of the body of the medial meniscus
likely reflecting a tiny radial tear.

ADDENDUM:
Not mentioned above: 9 mm T1 hypointense and mildly T2 hyperintense
mass in the subcutaneous fat of the mid anterior tibia just beneath
the skin surface with multiple adjacent small vessels. Differential
considerations include small vascular malformation versus peripheral
nerve sheath tumor or foreign body granuloma. If there is further
clinical concern, recommend a MRI with intravenous contrast prior to
any intervention.

*** End of Addendum ***
FINDINGS: Bones/Joint/Cartilage

No marrow signal abnormality. No fracture or dislocation. Normal
alignment. No joint effusion. Small Baker's cyst.

Incidental note made of lateral meniscal flounce. Attenuation of the
free edge of the body of the medial meniscus likely reflecting a
tiny radial tear.

Ligaments

Collateral ligaments are intact.

Muscles and Tendons
Muscles are normal.

Soft tissue
No fluid collection or hematoma. No soft tissue mass.
IMPRESSION: 1. No acute injury of the left lower leg. No evidence of stress
reaction.
2. Attenuation of the free edge of the body of the medial meniscus
likely reflecting a tiny radial tear.

## 2022-09-20 LAB — TSH: TSH: 7.45 — AB (ref 0.41–5.90)

## 2022-11-22 ENCOUNTER — Telehealth: Payer: Self-pay | Admitting: Emergency Medicine

## 2022-11-22 ENCOUNTER — Encounter: Payer: Self-pay | Admitting: Emergency Medicine

## 2022-11-22 ENCOUNTER — Ambulatory Visit
Admission: EM | Admit: 2022-11-22 | Discharge: 2022-11-22 | Disposition: A | Discharge status: Home / Self Care | Payer: No Typology Code available for payment source

## 2022-11-22 ENCOUNTER — Telehealth: Payer: Self-pay | Admitting: Nurse Practitioner

## 2022-11-22 ENCOUNTER — Other Ambulatory Visit: Payer: Self-pay

## 2022-11-22 DIAGNOSIS — J329 Chronic sinusitis, unspecified: Secondary | ICD-10-CM | POA: Diagnosis not present

## 2022-11-22 DIAGNOSIS — B9689 Other specified bacterial agents as the cause of diseases classified elsewhere: Secondary | ICD-10-CM | POA: Diagnosis not present

## 2022-11-22 DIAGNOSIS — J309 Allergic rhinitis, unspecified: Secondary | ICD-10-CM | POA: Diagnosis not present

## 2022-11-22 DIAGNOSIS — H66001 Acute suppurative otitis media without spontaneous rupture of ear drum, right ear: Secondary | ICD-10-CM | POA: Diagnosis not present

## 2022-11-22 HISTORY — DX: Disorder of thyroid, unspecified: E07.9

## 2022-11-22 MED ORDER — CEFDINIR 300 MG PO CAPS: 300.0000 mg | ORAL_CAPSULE | Freq: Two times a day (BID) | ORAL | 0 refills | Status: AC

## 2022-11-22 MED ORDER — AMOXICILLIN-POT CLAVULANATE 875-125 MG PO TABS: 1.0000 | ORAL_TABLET | Freq: Two times a day (BID) | ORAL | 0 refills | Status: DC

## 2022-11-22 NOTE — ED Triage Notes (Signed)
Pt reports left ear pain on Tuesday. Pt reports was seen at different urgent care on Wednesday and reports was prescribed amoxicillin and reports no improvement in symptoms. Pt reports has tried otc debrox drops and reports sinus congestion for last several weeks. Pt reports intermittent ringing sensation in left ear and reports "I can't hear anything out of that side."

## 2022-11-22 NOTE — ED Provider Notes (Signed)
[facility]    CSN: [id number] Arrival date & time: 11/22/22  1004      History   Chief Complaint Chief Complaint  Patient presents with   Ear Pain    HPI Brian Chavez is a 31 y.o. male.   Patient presents with wife today for left ear pressure and tinnitus that began today.  Reports he has had some ear pressure for the past week or so, was seen in an urgent care earlier this week and started on amoxicillin.  Reports this has not helped so far.  He also endorses approximately 2 months of congested cough, nasal congestion, sore throat only in the morning, postnasal drainage, and headache in his sinuses.  He denies abdominal pain, nausea/vomiting, diarrhea, decreased appetite, chest pain, shortness of breath.  He has been a little bit more tired than normal.  Has tried Debrox drops, flushing of his ear, and taking amoxicillin for more than 48 hours without improvement in symptoms.    Past Medical History:  Diagnosis Date   Kidney stones    Thyroid disease     There are no problems to display for this patient.   History reviewed. No pertinent surgical history.     Home Medications    Prior to Admission medications   Medication Sig Start Date End Date Taking? Authorizing Provider  amoxicillin-clavulanate (AUGMENTIN) 875-125 MG tablet Take 1 tablet by mouth 2 (two) times daily for 7 days. 11/22/22 11/29/22 Yes Valentino Nose, NP  levothyroxine (SYNTHROID) 50 MCG tablet Take 50 mcg by mouth daily before breakfast.   Yes [provider]    Family History History reviewed. No pertinent family history.  Social History Social History   Tobacco Use   Smoking status: Never   Smokeless tobacco: Former    Types: Chew  Substance Use Topics   Alcohol use: Yes    Comment: occasional    Drug use: No     Allergies   Patient has no known allergies.   Review of Systems Review of Systems Per HPI  Physical Exam Triage Vital Signs ED Triage  Vitals  Enc Vitals Group     BP 11/22/22 1028 136/86     Pulse Rate 11/22/22 1028 74     Resp 11/22/22 1028 20     Temp 11/22/22 1028 98.9 F (37.2 C)     Temp Source 11/22/22 1028 Oral     SpO2 11/22/22 1028 94 %     Weight --      Height --      Head Circumference --      Peak Flow --      Pain Score 11/22/22 1027 0     Pain Loc --      Pain Edu? --      Excl. in GC? --    No data found.  Updated Vital Signs BP 136/86 (BP Location: Right Arm)   Pulse 74   Temp 98.9 F (37.2 C) (Oral)   Resp 20   SpO2 94%   Visual Acuity Right Eye Distance:   Left Eye Distance:   Bilateral Distance:    Right Eye Near:   Left Eye Near:    Bilateral Near:     Physical Exam Vitals and nursing note reviewed.  Constitutional:      General: He is not in acute distress.    Appearance: Normal appearance. He is not ill-appearing or toxic-appearing.  HENT:     Head: Normocephalic and atraumatic.  Right Ear: Ear canal and external ear normal. Tympanic membrane is erythematous.     Left Ear: Ear canal and external ear normal. Tympanic membrane is erythematous and bulging.     Nose: Congestion and rhinorrhea present.     Mouth/Throat:     Mouth: Mucous membranes are moist.     Pharynx: Oropharynx is clear. Posterior oropharyngeal erythema present. No oropharyngeal exudate.  Eyes:     General: No scleral icterus.    Extraocular Movements: Extraocular movements intact.  Cardiovascular:     Rate and Rhythm: Normal rate and regular rhythm.  Pulmonary:     Effort: Pulmonary effort is normal. No respiratory distress.     Breath sounds: Normal breath sounds. No wheezing, rhonchi or rales.  Abdominal:     General: Abdomen is flat. Bowel sounds are normal. There is no distension.     Palpations: Abdomen is soft.     Tenderness: There is no abdominal tenderness. There is no guarding.  Musculoskeletal:     Cervical back: Normal range of motion and neck supple.  Lymphadenopathy:      Cervical: No cervical adenopathy.  Skin:    General: Skin is warm and dry.     Coloration: Skin is not jaundiced or pale.     Findings: No erythema or rash.  Neurological:     Mental Status: He is alert and oriented to person, place, and time.  Psychiatric:        Behavior: Behavior is cooperative.      UC Treatments / Results  Labs (all labs ordered are listed, but only abnormal results are displayed) Labs Reviewed - No data to display  EKG   Radiology No results found.  Procedures Procedures (including critical care time)  Medications Ordered in UC Medications - No data to display  Initial Impression / Assessment and Plan / UC Course  I have reviewed the triage vital signs and the nursing notes.  Pertinent labs & imaging results that were available during my care of the patient were reviewed by me and considered in my medical decision making (see chart for details).   Patient is well-appearing, normotensive, afebrile, not tachycardic, not tachypneic, oxygenating well on room air.    Non-recurrent acute suppurative otitis media of right ear without spontaneous rupture of tympanic membrane Given worsening ear pain despite amoxicillin, will change to Augmentin twice daily for 7 days Ongoing supportive care discussed with patient ER return precautions discussed Information given for ENT if tinnitus or altered hearing persists  Bacterial sinusitis Allergic rhinitis, unspecified seasonality, unspecified trigger I suspect there is a degree of uncontrolled allergic rhinitis; appears to have bacterial sinusitis today Will treat with Augmentin twice daily for 7 days-this will also cover for ear infection Recommended nasal saline rinses, oral antihistamine, nasal steroid year-round as needed to prevent recurrent ear/sinus infections Recommended follow-up with ENT if symptoms persist or worsen despite treatment  The patient was given the opportunity to ask questions.  All  questions answered to their satisfaction.  The patient is in agreement to this plan.    Final Clinical Impressions(s) / UC Diagnoses   Final diagnoses:  Non-recurrent acute suppurative otitis media of right ear without spontaneous rupture of tympanic membrane  Allergic rhinitis, unspecified seasonality, unspecified trigger  Bacterial sinusitis     Discharge Instructions      Please stop the amoxicillin and start Augmentin.  Take the Augmentin twice daily for 7 days.  This will treat both the ear infection and sinus  infection.    Please also start an oral antihistamine like cetirizine (Zyrtec) and nasal spray Flonase to help with chronic sinus issues.  These medicines will treat allergies and are safe to take year round or you can just take them during season changes.    You can also try sinus rinses to help clear out the congestion.   Follow up here or with ENT if symptoms do not improve with the medication.     ED Prescriptions     Medication Sig Dispense Auth. Provider   amoxicillin-clavulanate (AUGMENTIN) 875-125 MG tablet Take 1 tablet by mouth 2 (two) times daily for 7 days. 14 tablet Valentino Nose, NP      PDMP not reviewed this encounter.   Valentino Nose, NP 11/22/22 1142

## 2022-11-22 NOTE — Telephone Encounter (Signed)
Cefdinir sent to pharmacy for ear/sinus infection.

## 2022-11-22 NOTE — Telephone Encounter (Signed)
Pt called and reported pharmacy filled "the same medication I was told to stop." Pt reported amoxicillin at [facility] visit this morning but after speaking with pharmacy initial prescription sent in by other UC on Tuesday was for same medication. Consulted NP and reported pt would need to continue Augmentin but additional medication would be sent in to pharmacy to cover second infection. Pt aware additional medications would be electronically sent over to pharmacy.

## 2022-11-22 NOTE — Discharge Instructions (Addendum)
Please stop the amoxicillin and start Augmentin.  Take the Augmentin twice daily for 7 days.  This will treat both the ear infection and sinus infection.    Please also start an oral antihistamine like cetirizine (Zyrtec) and nasal spray Flonase to help with chronic sinus issues.  These medicines will treat allergies and are safe to take year round or you can just take them during season changes.    You can also try sinus rinses to help clear out the congestion.   Follow up here or with ENT if symptoms do not improve with the medication.

## 2022-12-25 ENCOUNTER — Ambulatory Visit: Payer: Self-pay

## 2022-12-31 NOTE — Patient Instructions (Signed)

## 2023-01-01 ENCOUNTER — Encounter: Payer: Self-pay | Admitting: Nurse Practitioner

## 2023-01-01 ENCOUNTER — Ambulatory Visit (INDEPENDENT_AMBULATORY_CARE_PROVIDER_SITE_OTHER): Payer: No Typology Code available for payment source | Admitting: Nurse Practitioner

## 2023-01-01 VITALS — BP 138/86 | HR 70 | Ht 74.0 in | Wt 382.2 lb

## 2023-01-01 DIAGNOSIS — E039 Hypothyroidism, unspecified: Secondary | ICD-10-CM

## 2023-01-01 NOTE — Progress Notes (Signed)
Endocrinology Consult Note                                         01/01/2023, 1:30 PM  Subjective:   Subjective    Brian Chavez is a 32 y.o.-year-old male patient being seen in consultation for hypothyroidism referred by Tilda Burrow, NP.   Past Medical History:  Diagnosis Date   Kidney stones    Thyroid disease     History reviewed. No pertinent surgical history.  Social History   Socioeconomic History   Marital status: Married    Spouse name: Not on file   Number of children: Not on file   Years of education: Not on file   Highest education level: Not on file  Occupational History   Not on file  Tobacco Use   Smoking status: Never   Smokeless tobacco: Former    Types: Chew  Substance and Sexual Activity   Alcohol use: Yes    Comment: occasional    Drug use: No   Sexual activity: Not on file  Other Topics Concern   Not on file  Social History Narrative   Not on file   Social Determinants of Health   Financial Resource Strain: Not on file  Food Insecurity: Not on file  Transportation Needs: Not on file  Physical Activity: Not on file  Stress: Not on file  Social Connections: Not on file    History reviewed. No pertinent family history.  Outpatient Encounter Medications as of 01/01/2023  Medication Sig   amoxicillin (AMOXIL) 500 MG capsule Take 500 mg by mouth 2 (two) times daily.   levothyroxine (SYNTHROID) 50 MCG tablet Take 50 mcg by mouth daily before breakfast.   No facility-administered encounter medications on file as of 01/01/2023.    ALLERGIES: No Known Allergies VACCINATION STATUS: Immunization History  Administered Date(s) Administered   Moderna Sars-Covid-2 Vaccination 01/03/2021, 02/03/2021     HPI   Brian Chavez  is a patient with the above medical history. he was diagnosed with hypothyroidism at approximate age of 28 years, which required subsequent  initiation of thyroid hormone replacement therapy. he was given various doses of Levothyroxine, currently on 50 micrograms. he reports compliance to this medication:  Taking it daily on empty stomach with water, separated by >30 minutes before breakfast and other medications, and by at least 4 hours from calcium, iron, PPIs, multivitamins .  I reviewed patient's thyroid tests:  Lab Results  Component Value Date   TSH 7.45 (A) 09/20/2022     Pt describes: - weight gain - fatigue - cold intolerance - joint aches  Pt denies feeling nodules in neck, hoarseness, dysphagia/odynophagia, SOB with lying down.  he does have family history of thyroid disorders in his mom (unsure of whether hypo or hyper).  No family history of thyroid cancer.  No history of radiation therapy to head or neck.  No recent use of iodine supplements.  Denies use of Biotin containing supplements.  ROS:  Constitutional: + significant weight gain, + fatigue, no subjective hyperthermia, + subjective hypothermia Eyes: no blurry vision, no xerophthalmia ENT: no sore throat, no nodules palpated in throat, no dysphagia/odynophagia, no hoarseness Cardiovascular: no chest pain, no SOB, no palpitations, no leg swelling Respiratory: no cough, no SOB Gastrointestinal: no nausea/vomiting/diarrhea Musculoskeletal: + muscle/joint aches Skin: no rashes Neurological: no tremors, no numbness, no tingling, no dizziness Psychiatric: no depression, no anxiety   Objective:   Objective     BP 138/86 (BP Location: Right Arm, Patient Position: Sitting, Cuff Size: Large) Comment: Manuel Cuff  Pulse 70   Ht 6\' 2"  (1.88 m)   Wt (!) 382 lb 3.2 oz (173.4 kg)   BMI 49.07 kg/m  Wt Readings from Last 3 Encounters:  01/01/23 (!) 382 lb 3.2 oz (173.4 kg)  05/25/21 (!) 362 lb (164.2 kg)  05/08/21 (!) 355 lb 9.6 oz (161.3 kg)    BP Readings from Last 3 Encounters:  01/01/23 138/86  11/22/22 136/86  05/25/21 (!) 149/104      Constitutional:  Body mass index is 49.07 kg/m., not in acute distress, normal state of mind Eyes: PERRLA, EOMI, no exophthalmos ENT: moist mucous membranes, no thyromegaly, no cervical lymphadenopathy Cardiovascular: normal precordial activity, RRR, no murmur/rubs/gallops Respiratory:  adequate breathing efforts, no gross chest deformity, Clear to auscultation bilaterally Gastrointestinal: abdomen soft, non-tender, no distension, bowel sounds present Musculoskeletal: no gross deformities, strength intact in all four extremities Skin: moist, warm, no rashes Neurological: no tremor with outstretched hands, deep tendon reflexes normal in BLE.   CMP ( most recent) CMP     Component Value Date/Time   NA 138 08/11/2018 1218   K 4.0 08/11/2018 1218   CL 103 08/11/2018 1218   CO2 24 08/11/2018 1218   GLUCOSE 97 08/11/2018 1218   BUN 11 08/11/2018 1218   CREATININE 0.91 08/11/2018 1218   CALCIUM 9.2 08/11/2018 1218   PROT 7.5 08/11/2018 1218   ALBUMIN 4.3 08/11/2018 1218   AST 20 08/11/2018 1218   ALT 31 08/11/2018 1218   ALKPHOS 35 (L) 08/11/2018 1218   BILITOT 1.3 (H) 08/11/2018 1218   GFRNONAA >60 08/11/2018 1218   GFRAA >60 08/11/2018 1218     Diabetic Labs (most recent): No results found for: "HGBA1C", "MICROALBUR"   Lipid Panel ( most recent) Lipid Panel  No results found for: "CHOL", "TRIG", "HDL", "CHOLHDL", "VLDL", "LDLCALC", "LDLDIRECT", "LABVLDL"     Lab Results  Component Value Date   TSH 7.45 (A) 09/20/2022      Assessment & Plan:   ASSESSMENT / PLAN:  1. Hypothyroidism-unspecified  Patient with long-standing hypothyroidism, on levothyroxine therapy. On physical exam, patient does not  have gross goiter, thyroid nodules, or neck compression symptoms.  - We discussed about correct intake of levothyroxine, at fasting, with water, separated by at least 30 minutes from breakfast, and separated by more than 4 hours from calcium, iron, multivitamins, acid  reflux medications (PPIs). -Patient is made aware of the fact that thyroid hormone replacement is needed for life, dose to be adjusted by periodic monitoring of thyroid function tests.  - Will check thyroid tests before next visit: TSH, free T4 and antibody testing to help classify his dysfunction.  -Due to absence of clinical goiter, no need for thyroid ultrasound.  - Time spent with the patient: 45 minutes, of which >50% was spent in obtaining information about his symptoms, reviewing his previous labs, evaluations, and treatments, counseling him about his hypothyroidism, and developing a  plan to confirm the diagnosis and long term treatment as necessary. Please refer to "Patient Self Inventory" in the Media tab for reviewed elements of pertinent patient history.  Brian Chavez participated in the discussions, expressed understanding, and voiced agreement with the above plans.  All questions were answered to his satisfaction. he is encouraged to contact clinic should he have any questions or concerns prior to his return visit.   FOLLOW UP PLAN:  Return in about 2 months (around 03/02/2023) for Thyroid follow up, Previsit labs (labs today and again prior to next visit).  Ronny Bacon, [facility] [address] [place], [place] [address] Phone: [phone] Fax: [phone]  01/01/2023, 1:30 PM

## 2023-01-02 ENCOUNTER — Other Ambulatory Visit: Payer: Self-pay | Admitting: Nurse Practitioner

## 2023-01-02 ENCOUNTER — Telehealth: Payer: Self-pay | Admitting: *Deleted

## 2023-01-02 LAB — THYROID PEROXIDASE ANTIBODY: Thyroperoxidase Ab SerPl-aCnc: 70 IU/mL — ABNORMAL HIGH (ref 0–34)

## 2023-01-02 LAB — TSH: TSH: 4.51 u[IU]/mL — ABNORMAL HIGH (ref 0.450–4.500)

## 2023-01-02 LAB — THYROGLOBULIN ANTIBODY: Thyroglobulin Antibody: <1 IU/mL (ref 0.0–0.9)

## 2023-01-02 LAB — T4, FREE: Free T4: 1.25 ng/dL (ref 0.82–1.77)

## 2023-01-02 MED ORDER — LEVOTHYROXINE SODIUM 75 MCG PO TABS: 75.0000 ug | ORAL_TABLET | Freq: Every day | ORAL | 1 refills | Status: DC

## 2023-01-02 NOTE — Telephone Encounter (Signed)
Patient was called and given his test results and instructions on how to take his thyroid medication.

## 2023-01-02 NOTE — Progress Notes (Signed)
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  It also indicated he is not quite on enough thyroid hormone.  I recommend increasing his dose of Levothyroxine to 75 mcg po daily before breakfast.  He can take 1.5 of his current 50 mcg tabs until he runs out.  I will send in a prescription for the 75 mcg to his pharmacy on file.

## 2023-01-02 NOTE — Telephone Encounter (Signed)
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  It also indicated he is not quite on enough thyroid hormone.  I recommend increasing his dose of Levothyroxine to 75 mcg po daily before breakfast.  He can take 1.5 of his current 50 mcg tabs until he runs out.  I will send in a prescription for the 75 mcg to his pharmacy on file.

## 2023-02-27 LAB — TSH: TSH: 4.26 u[IU]/mL (ref 0.450–4.500)

## 2023-02-27 LAB — T4, FREE: Free T4: 1.39 ng/dL (ref 0.82–1.77)

## 2023-02-28 NOTE — Patient Instructions (Signed)

## 2023-03-03 ENCOUNTER — Encounter: Payer: Self-pay | Admitting: Nurse Practitioner

## 2023-03-03 ENCOUNTER — Ambulatory Visit (INDEPENDENT_AMBULATORY_CARE_PROVIDER_SITE_OTHER): Payer: No Typology Code available for payment source | Admitting: Nurse Practitioner

## 2023-03-03 VITALS — BP 138/90 | Ht 74.0 in | Wt 380.2 lb

## 2023-03-03 DIAGNOSIS — E038 Other specified hypothyroidism: Secondary | ICD-10-CM

## 2023-03-03 DIAGNOSIS — E063 Autoimmune thyroiditis: Secondary | ICD-10-CM

## 2023-03-03 MED ORDER — LEVOTHYROXINE SODIUM 88 MCG PO TABS: 88.0000 ug | ORAL_TABLET | Freq: Every day | ORAL | 1 refills | Status: DC

## 2023-03-03 NOTE — Progress Notes (Signed)
Endocrinology Follow Up Note                                         03/03/2023, 10:12 AM  Subjective:   Subjective    Brian Chavez is a 32 y.o.-year-old male patient being seen in follow up after being seen in consultation for hypothyroidism referred by Tilda Burrow, NP.   Past Medical History:  Diagnosis Date   Kidney stones    Thyroid disease     History reviewed. No pertinent surgical history.  Social History   Socioeconomic History   Marital status: Married    Spouse name: Not on file   Number of children: Not on file   Years of education: Not on file   Highest education level: Not on file  Occupational History   Not on file  Tobacco Use   Smoking status: Never   Smokeless tobacco: Former    Types: Chew  Substance and Sexual Activity   Alcohol use: Yes    Comment: occasional    Drug use: No   Sexual activity: Not on file  Other Topics Concern   Not on file  Social History Narrative   Not on file   Social Determinants of Health   Financial Resource Strain: Not on file  Food Insecurity: Not on file  Transportation Needs: Not on file  Physical Activity: Not on file  Stress: Not on file  Social Connections: Not on file    History reviewed. No pertinent family history.  Outpatient Encounter Medications as of 03/03/2023  Medication Sig   fluticasone (FLONASE) 50 MCG/ACT nasal spray Place 1 spray into both nostrils daily. Patient states that uses twice a day   [DISCONTINUED] levothyroxine (SYNTHROID) 75 MCG tablet Take 1 tablet (75 mcg total) by mouth daily before breakfast.   amoxicillin (AMOXIL) 500 MG capsule Take 500 mg by mouth 2 (two) times daily. (Patient not taking: Reported on 03/03/2023)   levothyroxine (SYNTHROID) 88 MCG tablet Take 1 tablet (88 mcg total) by mouth daily before breakfast.   No facility-administered encounter medications on file as of 03/03/2023.     ALLERGIES: No Known Allergies VACCINATION STATUS: Immunization History  Administered Date(s) Administered   Moderna Sars-Covid-2 Vaccination 01/03/2021, 02/03/2021     HPI   Brian Chavez  is a patient with the above medical history. he was diagnosed with hypothyroidism at approximate age of 36 years, which required subsequent initiation of thyroid hormone replacement therapy. he was given various doses of Levothyroxine, currently on 75 micrograms (increased between visits after labs returned showing under-replacement). he reports compliance to this medication:  Taking it daily on empty stomach with water, separated by >30 minutes before breakfast and other medications, and by at least 4 hours from calcium, iron, PPIs, multivitamins .  I reviewed patient's thyroid tests:  Lab Results  Component Value Date   TSH 4.260 02/26/2023   TSH 4.510 (H) 01/01/2023   TSH 7.45 (A) 09/20/2022   FREET4 1.39 02/26/2023  FREET4 1.25 01/01/2023     Pt describes: - weight gain - fatigue - cold intolerance - joint aches  Pt denies feeling nodules in neck, hoarseness, dysphagia/odynophagia, SOB with lying down.  he does have family history of thyroid disorders in his mom (unsure of whether hypo or hyper).  No family history of thyroid cancer.  No history of radiation therapy to head or neck.  No recent use of iodine supplements.  Denies use of Biotin containing supplements.     ROS:  Constitutional: + significant weight gain (starting to level out), + fatigue, no subjective hyperthermia, + subjective hypothermia Eyes: no blurry vision, no xerophthalmia ENT: no sore throat, no nodules palpated in throat, no dysphagia/odynophagia, no hoarseness Cardiovascular: no chest pain, no SOB, no palpitations, no leg swelling Respiratory: no cough, no SOB Gastrointestinal: no nausea/vomiting/diarrhea Musculoskeletal: + muscle/joint aches Skin: no rashes Neurological: no tremors, no numbness,  no tingling, no dizziness Psychiatric: no depression, no anxiety   Objective:   Objective     BP (!) 138/90 (BP Location: Right Arm, Patient Position: Sitting, Cuff Size: Large) Comment: Retakes with manuel cuff  Ht 6\' 2"  (1.88 m)   Wt (!) 380 lb 3.2 oz (172.5 kg)   BMI 48.81 kg/m  Wt Readings from Last 3 Encounters:  03/03/23 (!) 380 lb 3.2 oz (172.5 kg)  01/01/23 (!) 382 lb 3.2 oz (173.4 kg)  05/25/21 (!) 362 lb (164.2 kg)    BP Readings from Last 3 Encounters:  03/03/23 (!) 138/90  01/01/23 138/86  11/22/22 136/86      Physical Exam- Limited  Constitutional:  Body mass index is 48.81 kg/m. , not in acute distress, normal state of mind Eyes:  EOMI, no exophthalmos Musculoskeletal: no gross deformities, strength intact in all four extremities, no gross restriction of joint movements Skin:  no rashes, no hyperemia Neurological: no tremor with outstretched hands   CMP ( most recent) CMP     Component Value Date/Time   NA 138 08/11/2018 1218   K 4.0 08/11/2018 1218   CL 103 08/11/2018 1218   CO2 24 08/11/2018 1218   GLUCOSE 97 08/11/2018 1218   BUN 11 08/11/2018 1218   CREATININE 0.91 08/11/2018 1218   CALCIUM 9.2 08/11/2018 1218   PROT 7.5 08/11/2018 1218   ALBUMIN 4.3 08/11/2018 1218   AST 20 08/11/2018 1218   ALT 31 08/11/2018 1218   ALKPHOS 35 (L) 08/11/2018 1218   BILITOT 1.3 (H) 08/11/2018 1218   GFRNONAA >60 08/11/2018 1218   GFRAA >60 08/11/2018 1218     Diabetic Labs (most recent): No results found for: "HGBA1C", "MICROALBUR"   Lipid Panel ( most recent) Lipid Panel  No results found for: "CHOL", "TRIG", "HDL", "CHOLHDL", "VLDL", "LDLCALC", "LDLDIRECT", "LABVLDL"     Lab Results  Component Value Date   TSH 4.260 02/26/2023   TSH 4.510 (H) 01/01/2023   TSH 7.45 (A) 09/20/2022   FREET4 1.39 02/26/2023   FREET4 1.25 01/01/2023     Latest Reference Range & Units 09/20/22 00:00 01/01/23 13:46 02/26/23 10:22  TSH 0.450 - 4.500 uIU/mL 7.45  ! 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    His previsit TFTs are consistent with slight under-replacement.  He is advised to increase his Levothyroxine to 88 mcg po daily before breakfast.  Will recheck in 3 months and adjust dose accordingly.  - We discussed about correct intake of levothyroxine, at fasting, with water, separated by at least 30 minutes from breakfast, and separated by more than 4 hours from calcium, iron, multivitamins, acid reflux medications (PPIs). -Patient is made aware of the fact that thyroid hormone replacement is needed for life, dose to be adjusted by periodic monitoring of thyroid function tests.   -Due to absence of clinical goiter, no need for thyroid ultrasound.   I spent  20  minutes in the care of the patient today including review of labs from Thyroid Function, CMP, and other relevant labs ; imaging/biopsy records (current and previous including abstractions from other facilities); face-to-face time discussing  his lab results and symptoms, medications doses, his options of short and long term treatment based on the latest standards of care / guidelines;   and documenting the encounter.  Felipa Eth  participated in the discussions, expressed understanding, and voiced agreement with the above plans.  All questions were answered to his satisfaction. he is encouraged to contact clinic should he have any questions or concerns prior to his return visit.   FOLLOW UP PLAN:  Return in about 3 months (around 06/03/2023) for Thyroid follow up, Previsit labs.  Rayetta Pigg, [facility] [address] [place], [place] [address] Phone: [phone] Fax: [phone]  03/03/2023, 10:12 AM

## 2023-06-10 ENCOUNTER — Ambulatory Visit: Payer: No Typology Code available for payment source | Admitting: Nurse Practitioner

## 2023-06-23 ENCOUNTER — Ambulatory Visit: Payer: No Typology Code available for payment source | Admitting: Nurse Practitioner

## 2023-06-23 DIAGNOSIS — E038 Other specified hypothyroidism: Secondary | ICD-10-CM

## 2023-08-12 ENCOUNTER — Ambulatory Visit: Payer: No Typology Code available for payment source | Admitting: Nurse Practitioner

## 2023-08-15 ENCOUNTER — Ambulatory Visit: Payer: No Typology Code available for payment source | Admitting: Nurse Practitioner

## 2023-08-15 DIAGNOSIS — E038 Other specified hypothyroidism: Secondary | ICD-10-CM

## 2023-08-19 ENCOUNTER — Telehealth: Payer: Self-pay | Admitting: Nurse Practitioner

## 2023-08-19 NOTE — Telephone Encounter (Signed)
Patient rescheduled his appt per front desk

## 2023-08-20 ENCOUNTER — Other Ambulatory Visit: Payer: Self-pay | Admitting: Nurse Practitioner

## 2023-08-21 ENCOUNTER — Encounter: Payer: Self-pay | Admitting: Nurse Practitioner

## 2023-08-21 ENCOUNTER — Ambulatory Visit (INDEPENDENT_AMBULATORY_CARE_PROVIDER_SITE_OTHER): Payer: No Typology Code available for payment source | Admitting: Nurse Practitioner

## 2023-08-21 VITALS — BP 140/72 | HR 64 | Ht 74.0 in | Wt 368.2 lb

## 2023-08-21 DIAGNOSIS — E063 Autoimmune thyroiditis: Secondary | ICD-10-CM | POA: Diagnosis not present

## 2023-08-21 DIAGNOSIS — E038 Other specified hypothyroidism: Secondary | ICD-10-CM

## 2023-08-21 MED ORDER — LEVOTHYROXINE SODIUM 100 MCG PO TABS: 100.0000 ug | ORAL_TABLET | Freq: Every day | ORAL | 1 refills | Status: DC

## 2023-08-21 NOTE — Patient Instructions (Signed)

## 2023-08-21 NOTE — Progress Notes (Signed)
Endocrinology Follow Up Note                                         08/21/2023, 12:42 PM  Subjective:   Subjective    Brian Chavez is a 32 y.o.-year-old male patient being seen in follow up after being seen in consultation for hypothyroidism referred by Karl Bales, NP.   Past Medical History:  Diagnosis Date   Kidney stones    Thyroid disease     History reviewed. No pertinent surgical history.  Social History   Socioeconomic History   Marital status: Married    Spouse name: Not on file   Number of children: Not on file   Years of education: Not on file   Highest education level: Not on file  Occupational History   Not on file  Tobacco Use   Smoking status: Never   Smokeless tobacco: Former    Types: Chew  Substance and Sexual Activity   Alcohol use: Yes    Comment: occasional    Drug use: No   Sexual activity: Not on file  Other Topics Concern   Not on file  Social History Narrative   Not on file   Social Determinants of Health   Financial Resource Strain: Not on file  Food Insecurity: Not on file  Transportation Needs: Not on file  Physical Activity: Not on file  Stress: Not on file  Social Connections: Not on file    History reviewed. No pertinent family history.  Outpatient Encounter Medications as of 08/21/2023  Medication Sig   fluticasone (FLONASE) 50 MCG/ACT nasal spray Place 1 spray into both nostrils daily. Patient states that uses twice a day   [DISCONTINUED] levothyroxine (SYNTHROID) 88 MCG tablet TAKE ONE TABLET BY MOUTH ONCE DAILY BEFORE BREAKFAST   amoxicillin (AMOXIL) 500 MG capsule Take 500 mg by mouth 2 (two) times daily. (Patient not taking: Reported on 03/03/2023)   levothyroxine (SYNTHROID) 100 MCG tablet Take 1 tablet (100 mcg total) by mouth daily before breakfast.   No facility-administered encounter medications on file as of 08/21/2023.    ALLERGIES: No  Known Allergies VACCINATION STATUS: Immunization History  Administered Date(s) Administered   Moderna Sars-Covid-2 Vaccination 01/03/2021, 02/03/2021     HPI   Brian Chavez is a patient with the above medical history. he was diagnosed with hypothyroidism at approximate age of 30 years, which required subsequent initiation of thyroid hormone replacement therapy. he was given various doses of Levothyroxine, currently on 75 micrograms (increased between visits after labs returned showing under-replacement). he reports compliance to this medication:  Taking it daily on empty stomach with water, separated by >30 minutes before breakfast and other medications, and by at least 4 hours from calcium, iron, PPIs, multivitamins .  I reviewed patient's thyroid tests:  Lab Results  Component Value Date   TSH 4.360 08/01/2023   TSH 4.260 02/26/2023   TSH 4.510 (H) 01/01/2023   TSH 7.45 (A) 09/20/2022   FREET4 1.48 08/01/2023  FREET4 1.39 02/26/2023   FREET4 1.25 01/01/2023     Pt describes: - weight gain - fatigue - cold intolerance - joint aches  Pt denies feeling nodules in neck, hoarseness, dysphagia/odynophagia, SOB with lying down.  he does have family history of thyroid disorders in his mom (unsure of whether hypo or hyper).  No family history of thyroid cancer.  No history of radiation therapy to head or neck.  No recent use of iodine supplements.  Denies use of Biotin containing supplements.     ROS:  Constitutional: + steadily decreasing body weight, + fatigue, no subjective hyperthermia, + subjective hypothermia Eyes: no blurry vision, no xerophthalmia ENT: no sore throat, no nodules palpated in throat, no dysphagia/odynophagia, no hoarseness Cardiovascular: no chest pain, no SOB, no palpitations, no leg swelling Respiratory: no cough, no SOB Gastrointestinal: no nausea/vomiting/diarrhea Musculoskeletal: + muscle/joint aches Skin: no rashes Neurological: no tremors,  no numbness, no tingling, no dizziness Psychiatric: no depression, no anxiety   Objective:   Objective     BP (!) 140/72 (BP Location: Left Arm, Patient Position: Sitting, Cuff Size: Large) Comment: Retake Manuel Cuff  Pulse 64   Ht 6\' 2"  (1.88 m)   Wt (!) 368 lb 3.2 oz (167 kg)   BMI 47.27 kg/m  Wt Readings from Last 3 Encounters:  08/21/23 (!) 368 lb 3.2 oz (167 kg)  03/03/23 (!) 380 lb 3.2 oz (172.5 kg)  01/01/23 (!) 382 lb 3.2 oz (173.4 kg)    BP Readings from Last 3 Encounters:  08/21/23 (!) 140/72  03/03/23 (!) 138/90  01/01/23 138/86      Physical Exam- Limited  Constitutional:  Body mass index is 47.27 kg/m. , not in acute distress, normal state of mind Eyes:  EOMI, no exophthalmos Musculoskeletal: no gross deformities, strength intact in all four extremities, no gross restriction of joint movements Skin:  no rashes, no hyperemia Neurological: no tremor with outstretched hands   CMP ( most recent) CMP     Component Value Date/Time   NA 138 08/11/2018 1218   K 4.0 08/11/2018 1218   CL 103 08/11/2018 1218   CO2 24 08/11/2018 1218   GLUCOSE 97 08/11/2018 1218   BUN 11 08/11/2018 1218   CREATININE 0.91 08/11/2018 1218   CALCIUM 9.2 08/11/2018 1218   PROT 7.5 08/11/2018 1218   ALBUMIN 4.3 08/11/2018 1218   AST 20 08/11/2018 1218   ALT 31 08/11/2018 1218   ALKPHOS 35 (L) 08/11/2018 1218   BILITOT 1.3 (H) 08/11/2018 1218   GFRNONAA >60 08/11/2018 1218   GFRAA >60 08/11/2018 1218     Diabetic Labs (most recent): No results found for: "HGBA1C", "MICROALBUR"   Lipid Panel ( most recent) Lipid Panel  No results found for: "CHOL", "TRIG", "HDL", "CHOLHDL", "VLDL", "LDLCALC", "LDLDIRECT", "LABVLDL"     Lab Results  Component Value Date   TSH 4.360 08/01/2023   TSH 4.260 02/26/2023   TSH 4.510 (H) 01/01/2023   TSH 7.45 (A) 09/20/2022   FREET4 1.48 08/01/2023   FREET4 1.39 02/26/2023   FREET4 1.25 01/01/2023     Latest Reference Range & Units  09/20/22 00:00 01/01/23 13:46 02/26/23 10:22 08/01/23 11:16  TSH 0.450 - 4.500 uIU/mL 7.45 ! 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    His previsit TFTs are consistent with slight under-replacement.  He is advised to increase his Levothyroxine to 100 mcg po daily before breakfast.  Will recheck in 3 months and adjust dose accordingly.  - We discussed about correct intake of levothyroxine, at fasting, with water, separated by at least 30 minutes from breakfast, and separated by more than 4 hours from calcium, iron, multivitamins, acid reflux medications (PPIs). -Patient is made aware of the fact that thyroid hormone replacement is needed for life, dose to be adjusted by periodic monitoring of thyroid function tests.   -Due to absence of clinical goiter, no need for thyroid ultrasound.    I spent  18  minutes in the care of the patient today including review of labs from Thyroid Function, CMP, and other relevant labs ; imaging/biopsy records (current and previous including abstractions from other facilities); face-to-face time discussing  his lab results and symptoms, medications doses, his options of short and long term treatment based on the latest standards of care / guidelines;   and documenting the encounter.  Hewitt Blade  participated in the discussions, expressed understanding, and voiced agreement with the above plans.  All questions were answered to his satisfaction. he is encouraged to contact clinic should he have any questions or concerns prior to his return visit.   FOLLOW UP PLAN:  Return in about 3 months  (around 11/20/2023) for Thyroid follow up, Previsit labs.  Ronny Bacon, [facility] [address] [place], [place] [address] Phone: [phone] Fax: [phone]  08/21/2023, 12:42 PM

## 2023-11-20 LAB — T4, FREE: Free T4: 1.37 ng/dL (ref 0.82–1.77)

## 2023-11-20 LAB — TSH: TSH: 6.36 u[IU]/mL — ABNORMAL HIGH (ref 0.450–4.500)

## 2023-11-27 ENCOUNTER — Encounter: Payer: Self-pay | Admitting: Nurse Practitioner

## 2023-11-27 ENCOUNTER — Ambulatory Visit (INDEPENDENT_AMBULATORY_CARE_PROVIDER_SITE_OTHER): Payer: No Typology Code available for payment source | Admitting: Nurse Practitioner

## 2023-11-27 VITALS — BP 116/74 | HR 76 | Ht 74.0 in | Wt 371.8 lb

## 2023-11-27 DIAGNOSIS — E063 Autoimmune thyroiditis: Secondary | ICD-10-CM

## 2023-11-27 MED ORDER — LEVOTHYROXINE SODIUM 125 MCG PO TABS: 125.0000 ug | ORAL_TABLET | Freq: Every day | ORAL | 1 refills | Status: DC

## 2023-11-27 NOTE — Progress Notes (Signed)
Endocrinology Follow Up Note                                         11/27/2023, 12:42 PM  Subjective:   Subjective    Brian Chavez is a 32 y.o.-year-old male patient being seen in follow up after being seen in consultation for hypothyroidism referred by Karl Bales, NP.   Past Medical History:  Diagnosis Date   Kidney stones    Thyroid disease     History reviewed. No pertinent surgical history.  Social History   Socioeconomic History   Marital status: Married    Spouse name: Not on file   Number of children: Not on file   Years of education: Not on file   Highest education level: Not on file  Occupational History   Not on file  Tobacco Use   Smoking status: Never   Smokeless tobacco: Former    Types: Chew  Substance and Sexual Activity   Alcohol use: Yes    Comment: occasional    Drug use: No   Sexual activity: Not on file  Other Topics Concern   Not on file  Social History Narrative   Not on file   Social Drivers of Health   Financial Resource Strain: Not on file  Food Insecurity: Not on file  Transportation Needs: Not on file  Physical Activity: Not on file  Stress: Not on file  Social Connections: Not on file    History reviewed. No pertinent family history.  Outpatient Encounter Medications as of 11/27/2023  Medication Sig   fluticasone (FLONASE) 50 MCG/ACT nasal spray Place 1 spray into both nostrils daily. Patient states that uses twice a day   Loratadine (CLARITIN) 10 MG CAPS Take 10 mg by mouth daily.   [DISCONTINUED] levothyroxine (SYNTHROID) 100 MCG tablet Take 1 tablet (100 mcg total) by mouth daily before breakfast.   levothyroxine (SYNTHROID) 125 MCG tablet Take 1 tablet (125 mcg total) by mouth daily before breakfast.   [DISCONTINUED] amoxicillin (AMOXIL) 500 MG capsule Take 500 mg by mouth 2 (two) times daily. (Patient not taking: Reported on 03/03/2023)   No  facility-administered encounter medications on file as of 11/27/2023.    ALLERGIES: No Known Allergies VACCINATION STATUS: Immunization History  Administered Date(s) Administered   Moderna Sars-Covid-2 Vaccination 01/03/2021, 02/03/2021     HPI   Brian Chavez is a patient with the above medical history. Brian Chavez was diagnosed with hypothyroidism at approximate age of 30 years, which required subsequent initiation of thyroid hormone replacement therapy. Brian Chavez was given various doses of Levothyroxine, currently on 75 micrograms (increased between visits after labs returned showing under-replacement). Brian Chavez reports compliance to this medication:  Taking it daily on empty stomach with water, separated by >30 minutes before breakfast and other medications, and by at least 4 hours from calcium, iron, PPIs, multivitamins .  I reviewed patient's thyroid tests:  Lab Results  Component Value Date   TSH 6.360 (H) 11/19/2023   TSH 4.360 08/01/2023  TSH 4.260 02/26/2023   TSH 4.510 (H) 01/01/2023   TSH 7.45 (A) 09/20/2022   FREET4 1.37 11/19/2023   FREET4 1.48 08/01/2023   FREET4 1.39 02/26/2023   FREET4 1.25 01/01/2023     Pt describes: - weight gain - fatigue - cold intolerance - joint aches  Pt denies feeling nodules in neck, hoarseness, dysphagia/odynophagia, SOB with lying down.  Brian Chavez does have family history of thyroid disorders in Brian Chavez mom (unsure of whether hypo or hyper).  No family history of thyroid cancer.  No history of radiation therapy to head or neck.  No recent use of iodine supplements.  Denies use of Biotin containing supplements.     ROS:  Constitutional: + increasing body weight, + fatigue, no subjective hyperthermia, + subjective hypothermia, + increased appetite Eyes: no blurry vision, no xerophthalmia ENT: no sore throat, no nodules palpated in throat, no dysphagia/odynophagia, no hoarseness Cardiovascular: no chest pain, no SOB, no palpitations, no leg  swelling Respiratory: no cough, no SOB Gastrointestinal: no nausea/vomiting/diarrhea Musculoskeletal: + muscle/joint aches Skin: no rashes Neurological: no tremors, no numbness, no tingling, no dizziness Psychiatric: no depression, no anxiety   Objective:   Objective     BP 116/74 (BP Location: Right Arm, Patient Position: Sitting, Cuff Size: Large)   Pulse 76   Ht 6\' 2"  (1.88 m)   Wt (!) 371 lb 12.8 oz (168.6 kg)   BMI 47.74 kg/m  Wt Readings from Last 3 Encounters:  11/27/23 (!) 371 lb 12.8 oz (168.6 kg)  08/21/23 (!) 368 lb 3.2 oz (167 kg)  03/03/23 (!) 380 lb 3.2 oz (172.5 kg)    BP Readings from Last 3 Encounters:  11/27/23 116/74  08/21/23 (!) 140/72  03/03/23 (!) 138/90      Physical Exam- Limited  Constitutional:  Body mass index is 47.74 kg/m. , not in acute distress, normal state of mind Eyes:  EOMI, no exophthalmos Musculoskeletal: no gross deformities, strength intact in all four extremities, no gross restriction of joint movements Skin:  no rashes, no hyperemia Neurological: no tremor with outstretched hands   CMP ( most recent) CMP     Component Value Date/Time   NA 138 08/11/2018 1218   K 4.0 08/11/2018 1218   CL 103 08/11/2018 1218   CO2 24 08/11/2018 1218   GLUCOSE 97 08/11/2018 1218   BUN 11 08/11/2018 1218   CREATININE 0.91 08/11/2018 1218   CALCIUM 9.2 08/11/2018 1218   PROT 7.5 08/11/2018 1218   ALBUMIN 4.3 08/11/2018 1218   AST 20 08/11/2018 1218   ALT 31 08/11/2018 1218   ALKPHOS 35 (L) 08/11/2018 1218   BILITOT 1.3 (H) 08/11/2018 1218   GFRNONAA >60 08/11/2018 1218   GFRAA >60 08/11/2018 1218     Diabetic Labs (most recent): No results found for: "HGBA1C", "MICROALBUR"   Lipid Panel ( most recent) Lipid Panel  No results found for: "CHOL", "TRIG", "HDL", "CHOLHDL", "VLDL", "LDLCALC", "LDLDIRECT", "LABVLDL"     Lab Results  Component Value Date   TSH 6.360 (H) 11/19/2023   TSH 4.360 08/01/2023   TSH 4.260 02/26/2023    TSH 4.510 (H) 01/01/2023   TSH 7.45 (A) 09/20/2022   FREET4 1.37 11/19/2023   FREET4 1.48 08/01/2023   FREET4 1.39 02/26/2023   FREET4 1.25 01/01/2023     Latest Reference Range & Units 09/20/22 00:00 01/01/23 13:46 02/26/23 10:22 08/01/23 11:16 11/19/23 09:48  TSH 0.450 - 4.500 uIU/mL 7.45 ! 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    Brian Chavez previsit TFTs are consistent with slight under-replacement.  Brian Chavez is advised to increase Brian Chavez Levothyroxine to 125 mcg po daily before breakfast.  Will recheck in 3 months and adjust dose accordingly.  - We discussed about correct intake of levothyroxine, at fasting, with water, separated by at least 30 minutes from breakfast, and separated by more than 4 hours from calcium, iron, multivitamins, acid reflux medications (PPIs). -Patient is made aware of the fact that thyroid hormone replacement is needed for life, dose to be adjusted by periodic monitoring of thyroid function tests.   -Due to absence of clinical goiter, no need for thyroid ultrasound.    I spent  14  minutes in the care of the patient today including review of labs from Thyroid Function, CMP, and other relevant labs ; imaging/biopsy records (current and previous including abstractions from other facilities); face-to-face time discussing  Brian Chavez lab results and symptoms, medications doses, Brian Chavez options of short and long term treatment based on the latest standards of care / guidelines;   and documenting the encounter.  Hewitt Blade  participated in the discussions, expressed understanding, and voiced  agreement with the above plans.  All questions were answered to Brian Chavez satisfaction. Brian Chavez is encouraged to contact clinic should Brian Chavez have any questions or concerns prior to Brian Chavez return visit.   FOLLOW UP PLAN:  Return in about 3 months (around 02/25/2024) for Thyroid follow up, Previsit labs.  Ronny Bacon, [facility] [address] [place], [place] [address] Phone: [phone] Fax: [phone]  11/27/2023, 12:42 PM

## 2023-11-27 NOTE — Patient Instructions (Signed)

## 2024-02-25 LAB — TSH: TSH: 3.28 u[IU]/mL (ref 0.450–4.500)

## 2024-02-25 LAB — T4, FREE: Free T4: 1.49 ng/dL (ref 0.82–1.77)

## 2024-03-01 NOTE — Patient Instructions (Signed)

## 2024-03-02 ENCOUNTER — Encounter: Payer: Self-pay | Admitting: Nurse Practitioner

## 2024-03-02 ENCOUNTER — Ambulatory Visit (INDEPENDENT_AMBULATORY_CARE_PROVIDER_SITE_OTHER): Payer: No Typology Code available for payment source | Admitting: Nurse Practitioner

## 2024-03-02 VITALS — BP 116/86 | HR 67 | Ht 74.0 in | Wt 371.8 lb

## 2024-03-02 DIAGNOSIS — E063 Autoimmune thyroiditis: Secondary | ICD-10-CM

## 2024-03-02 MED ORDER — LEVOTHYROXINE SODIUM 137 MCG PO TABS: 137.0000 ug | ORAL_TABLET | Freq: Every day | ORAL | 1 refills | Status: DC

## 2024-03-02 NOTE — Progress Notes (Signed)
 Endocrinology Follow Up Note                                         03/02/2024, 3:11 PM  Subjective:   Subjective    Brian Chavez is a 33 y.o.-year-old male patient being seen in follow up after being seen in consultation for hypothyroidism referred by Karl Bales, NP.   Past Medical History:  Diagnosis Date   Kidney stones    Thyroid disease     History reviewed. No pertinent surgical history.  Social History   Socioeconomic History   Marital status: Married    Spouse name: Not on file   Number of children: Not on file   Years of education: Not on file   Highest education level: Not on file  Occupational History   Not on file  Tobacco Use   Smoking status: Never   Smokeless tobacco: Former    Types: Chew  Substance and Sexual Activity   Alcohol use: Yes    Comment: occasional    Drug use: No   Sexual activity: Not on file  Other Topics Concern   Not on file  Social History Narrative   Not on file   Social Drivers of Health   Financial Resource Strain: Not on file  Food Insecurity: Not on file  Transportation Needs: Not on file  Physical Activity: Not on file  Stress: Not on file  Social Connections: Not on file    History reviewed. No pertinent family history.  Outpatient Encounter Medications as of 03/02/2024  Medication Sig   fluticasone (FLONASE) 50 MCG/ACT nasal spray Place 1 spray into both nostrils daily. Patient states that uses twice a day   Loratadine (CLARITIN) 10 MG CAPS Take 10 mg by mouth daily.   [DISCONTINUED] levothyroxine (SYNTHROID) 125 MCG tablet Take 1 tablet (125 mcg total) by mouth daily before breakfast.   levothyroxine (SYNTHROID) 137 MCG tablet Take 1 tablet (137 mcg total) by mouth daily before breakfast.   No facility-administered encounter medications on file as of 03/02/2024.    ALLERGIES: No Known Allergies VACCINATION STATUS: Immunization  History  Administered Date(s) Administered   Moderna Sars-Covid-2 Vaccination 01/03/2021, 02/03/2021     HPI   Brian Chavez is a patient with the above medical history. he was diagnosed with hypothyroidism at approximate age of 30 years, which required subsequent initiation of thyroid hormone replacement therapy. he was given various doses of Levothyroxine, currently on 75 micrograms (increased between visits after labs returned showing under-replacement). he reports compliance to this medication:  Taking it daily on empty stomach with water, separated by >30 minutes before breakfast and other medications, and by at least 4 hours from calcium, iron, PPIs, multivitamins .  I reviewed patient's thyroid tests:  Lab Results  Component Value Date   TSH 3.280 02/24/2024   TSH 6.360 (H) 11/19/2023   TSH 4.360 08/01/2023   TSH 4.260 02/26/2023   TSH 4.510 (H) 01/01/2023   TSH 7.45 (A) 09/20/2022  FREET4 1.49 02/24/2024   FREET4 1.37 11/19/2023   FREET4 1.48 08/01/2023   FREET4 1.39 02/26/2023   FREET4 1.25 01/01/2023     Pt describes: - weight gain - fatigue - cold intolerance - joint aches  Pt denies feeling nodules in neck, hoarseness, dysphagia/odynophagia, SOB with lying down.  he does have family history of thyroid disorders in his mom (unsure of whether hypo or hyper).  No family history of thyroid cancer.  No history of radiation therapy to head or neck.  No recent use of iodine supplements.  Denies use of Biotin containing supplements.     ROS:  Constitutional: + stable body weight, + fatigue-improving somewhat, no subjective hyperthermia, + subjective hypothermia Eyes: no blurry vision, no xerophthalmia ENT: no sore throat, no nodules palpated in throat, no dysphagia/odynophagia, no hoarseness Cardiovascular: no chest pain, no SOB, no palpitations, no leg swelling Respiratory: no cough, no SOB Gastrointestinal: no nausea/vomiting/diarrhea Musculoskeletal: +  muscle/joint aches Skin: no rashes Neurological: no tremors, no numbness, no tingling, no dizziness Psychiatric: no depression, no anxiety, + mild irritability   Objective:   Objective     BP 116/86 (BP Location: Right Arm, Patient Position: Sitting, Cuff Size: Large)   Pulse 67   Ht 6\' 2"  (1.88 m)   Wt (!) 371 lb 12.8 oz (168.6 kg)   BMI 47.74 kg/m  Wt Readings from Last 3 Encounters:  03/02/24 (!) 371 lb 12.8 oz (168.6 kg)  11/27/23 (!) 371 lb 12.8 oz (168.6 kg)  08/21/23 (!) 368 lb 3.2 oz (167 kg)    BP Readings from Last 3 Encounters:  03/02/24 116/86  11/27/23 116/74  08/21/23 (!) 140/72      Physical Exam- Limited  Constitutional:  Body mass index is 47.74 kg/m. , not in acute distress, normal state of mind Eyes:  EOMI, no exophthalmos Musculoskeletal: no gross deformities, strength intact in all four extremities, no gross restriction of joint movements Skin:  no rashes, no hyperemia Neurological: no tremor with outstretched hands   CMP ( most recent) CMP     Component Value Date/Time   NA 138 08/11/2018 1218   K 4.0 08/11/2018 1218   CL 103 08/11/2018 1218   CO2 24 08/11/2018 1218   GLUCOSE 97 08/11/2018 1218   BUN 11 08/11/2018 1218   CREATININE 0.91 08/11/2018 1218   CALCIUM 9.2 08/11/2018 1218   PROT 7.5 08/11/2018 1218   ALBUMIN 4.3 08/11/2018 1218   AST 20 08/11/2018 1218   ALT 31 08/11/2018 1218   ALKPHOS 35 (L) 08/11/2018 1218   BILITOT 1.3 (H) 08/11/2018 1218   GFRNONAA >60 08/11/2018 1218   GFRAA >60 08/11/2018 1218     Diabetic Labs (most recent): No results found for: "HGBA1C", "MICROALBUR"   Lipid Panel ( most recent) Lipid Panel  No results found for: "CHOL", "TRIG", "HDL", "CHOLHDL", "VLDL", "LDLCALC", "LDLDIRECT", "LABVLDL"     Lab Results  Component Value Date   TSH 3.280 02/24/2024   TSH 6.360 (H) 11/19/2023   TSH 4.360 08/01/2023   TSH 4.260 02/26/2023   TSH 4.510 (H) 01/01/2023   TSH 7.45 (A) 09/20/2022   FREET4  1.49 02/24/2024   FREET4 1.37 11/19/2023   FREET4 1.48 08/01/2023   FREET4 1.39 02/26/2023   FREET4 1.25 01/01/2023     Latest Reference Range & Units 09/20/22 00:00 01/01/23 13:46 02/26/23 10:22 08/01/23 11:16 11/19/23 09:48 02/24/24 10:47  TSH 0.450 - 4.500 uIU/mL 7.45 ! 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    His previsit TFTs show improvement, not quite at goal.  Will increase his Levothyroxine to 137 mcg po daily before breakfast.  Will recheck in 3 months and adjust dose accordingly.  - We discussed about correct intake of levothyroxine, at fasting, with water, separated by at least 30 minutes from breakfast, and separated by more than 4 hours from calcium, iron, multivitamins, acid reflux medications (PPIs). -Patient is made aware of the fact that thyroid hormone replacement is needed for life, dose to be adjusted by periodic monitoring of thyroid function tests.   -Due to absence of clinical goiter, no need for thyroid ultrasound.    I spent  13  minutes in the care of the patient today including review of labs from Thyroid Function, CMP, and other relevant labs ; imaging/biopsy records (current and previous including abstractions from other facilities); face-to-face time discussing  his lab results and symptoms, medications doses, his options of short and long term treatment based on the latest standards of care / guidelines;   and documenting the encounter.  Hewitt Blade  participated in the discussions, expressed understanding, and voiced agreement with the above plans.  All  questions were answered to his satisfaction. he is encouraged to contact clinic should he have any questions or concerns prior to his return visit.   FOLLOW UP PLAN:  Return in about 3 months (around 06/02/2024) for Thyroid follow up, Previsit labs.  Ronny Bacon, [facility] [address] [place], [place] [address] Phone: [phone] Fax: [phone]  03/02/2024, 3:11 PM

## 2024-06-15 ENCOUNTER — Ambulatory Visit: Admitting: Nurse Practitioner

## 2024-06-15 LAB — TSH: TSH: 3.68 u[IU]/mL (ref 0.450–4.500)

## 2024-06-15 LAB — T4, FREE: Free T4: 1.68 ng/dL (ref 0.82–1.77)

## 2024-06-17 NOTE — Patient Instructions (Signed)

## 2024-06-21 ENCOUNTER — Ambulatory Visit (INDEPENDENT_AMBULATORY_CARE_PROVIDER_SITE_OTHER): Admitting: Nurse Practitioner

## 2024-06-21 ENCOUNTER — Encounter: Payer: Self-pay | Admitting: Nurse Practitioner

## 2024-06-21 VITALS — BP 118/88 | HR 65 | Ht 74.0 in | Wt 360.0 lb

## 2024-06-21 DIAGNOSIS — E063 Autoimmune thyroiditis: Secondary | ICD-10-CM | POA: Diagnosis not present

## 2024-06-21 MED ORDER — LEVOTHYROXINE SODIUM 137 MCG PO TABS: 137.0000 ug | ORAL_TABLET | Freq: Every day | ORAL | 1 refills | Status: DC

## 2024-06-21 MED ORDER — CEPHALEXIN 250 MG PO CAPS: 250.0000 mg | ORAL_CAPSULE | Freq: Four times a day (QID) | ORAL | 0 refills | Status: AC

## 2024-06-21 NOTE — Progress Notes (Signed)
 Endocrinology Follow Up Note                                         06/21/2024, 3:20 PM  Subjective:   Subjective    Brian Chavez is a 33 y.o.-year-old male patient being seen in follow up after being seen in consultation for hypothyroidism referred by Joshua Rayfield RAMAN, NP.   Past Medical History:  Diagnosis Date   Kidney stones    Thyroid  disease     History reviewed. No pertinent surgical history.  Social History   Socioeconomic History   Marital status: Married    Spouse name: Not on file   Number of children: Not on file   Years of education: Not on file   Highest education level: Not on file  Occupational History   Not on file  Tobacco Use   Smoking status: Never   Smokeless tobacco: Former    Types: Chew  Substance and Sexual Activity   Alcohol use: Yes    Comment: occasional    Drug use: No   Sexual activity: Not on file  Other Topics Concern   Not on file  Social History Narrative   Not on file   Social Drivers of Health   Financial Resource Strain: Not on file  Food Insecurity: Not on file  Transportation Needs: Not on file  Physical Activity: Not on file  Stress: Not on file  Social Connections: Not on file    History reviewed. No pertinent family history.  Outpatient Encounter Medications as of 06/21/2024  Medication Sig   cephALEXin  (KEFLEX ) 250 MG capsule Take 1 capsule (250 mg total) by mouth 4 (four) times daily for 7 days.   fluticasone (FLONASE) 50 MCG/ACT nasal spray Place 1 spray into both nostrils daily. Patient states that uses twice a day   levothyroxine  (SYNTHROID ) 137 MCG tablet Take 1 tablet (137 mcg total) by mouth daily before breakfast.   Loratadine (CLARITIN) 10 MG CAPS Take 10 mg by mouth daily. (Patient not taking: Reported on 06/21/2024)   [DISCONTINUED] levothyroxine  (SYNTHROID ) 137 MCG tablet Take 1 tablet (137 mcg total) by mouth daily before breakfast.    No facility-administered encounter medications on file as of 06/21/2024.    ALLERGIES: No Known Allergies VACCINATION STATUS: Immunization History  Administered Date(s) Administered   Moderna Sars-Covid-2 Vaccination 01/03/2021, 02/03/2021     HPI   Brian Chavez is a patient with the above medical history. he was diagnosed with hypothyroidism at approximate age of 30 years, which required subsequent initiation of thyroid  hormone replacement therapy. he was given various doses of Levothyroxine , currently on 75 micrograms (increased between visits after labs returned showing under-replacement). he reports compliance to this medication:  Taking it daily on empty stomach with water, separated by >30 minutes before breakfast and other medications, and by at least 4 hours from calcium, iron, PPIs, multivitamins .  I reviewed patient's thyroid  tests:  Lab Results  Component Value Date   TSH 3.680 06/14/2024  TSH 3.280 02/24/2024   TSH 6.360 (H) 11/19/2023   TSH 4.360 08/01/2023   TSH 4.260 02/26/2023   TSH 4.510 (H) 01/01/2023   TSH 7.45 (A) 09/20/2022   FREET4 1.68 06/14/2024   FREET4 1.49 02/24/2024   FREET4 1.37 11/19/2023   FREET4 1.48 08/01/2023   FREET4 1.39 02/26/2023   FREET4 1.25 01/01/2023     Pt describes: - weight gain - fatigue - cold intolerance - joint aches  Pt denies feeling nodules in neck, hoarseness, dysphagia/odynophagia, SOB with lying down.  he does have family history of thyroid  disorders in his mom (unsure of whether hypo or hyper).  No family history of thyroid  cancer.  No history of radiation therapy to head or neck.  No recent use of iodine supplements.  Denies use of Biotin containing supplements.     ROS:  Constitutional: + decreasing body weight, + fatigue-improving, no subjective hyperthermia, + subjective hypothermia-improved Eyes: no blurry vision, no xerophthalmia ENT: no sore throat, no nodules palpated in throat, no  dysphagia/odynophagia, no hoarseness Cardiovascular: no chest pain, no SOB, no palpitations, no leg swelling Respiratory: no cough, no SOB Gastrointestinal: no nausea/vomiting/diarrhea Musculoskeletal: + muscle/joint aches-improved Skin: no rashes, left great toe pain- noticed puss a few days ago Neurological: no tremors, no numbness, no tingling, no dizziness Psychiatric: no depression, no anxiety   Objective:   Objective     BP 118/88 (BP Location: Right Arm, Patient Position: Sitting, Cuff Size: Large)   Pulse 65   Ht 6' 2 (1.88 m)   Wt (!) 360 lb (163.3 kg)   BMI 46.22 kg/m  Wt Readings from Last 3 Encounters:  06/21/24 (!) 360 lb (163.3 kg)  03/02/24 (!) 371 lb 12.8 oz (168.6 kg)  11/27/23 (!) 371 lb 12.8 oz (168.6 kg)    BP Readings from Last 3 Encounters:  06/21/24 118/88  03/02/24 116/86  11/27/23 116/74      Physical Exam- Limited  Constitutional:  Body mass index is 46.22 kg/m. , not in acute distress, normal state of mind Eyes:  EOMI, no exophthalmos Musculoskeletal: no gross deformities, strength intact in all four extremities, no gross restriction of joint movements Skin:  no rashes, no hyperemia, erythema, edema, tenderness to palpation of left great toe infection (from ingrown removal during pedicure recently) Neurological: no tremor with outstretched hands   CMP ( most recent) CMP     Component Value Date/Time   NA 138 08/11/2018 1218   K 4.0 08/11/2018 1218   CL 103 08/11/2018 1218   CO2 24 08/11/2018 1218   GLUCOSE 97 08/11/2018 1218   BUN 11 08/11/2018 1218   CREATININE 0.91 08/11/2018 1218   CALCIUM 9.2 08/11/2018 1218   PROT 7.5 08/11/2018 1218   ALBUMIN 4.3 08/11/2018 1218   AST 20 08/11/2018 1218   ALT 31 08/11/2018 1218   ALKPHOS 35 (L) 08/11/2018 1218   BILITOT 1.3 (H) 08/11/2018 1218   GFRNONAA >60 08/11/2018 1218   GFRAA >60 08/11/2018 1218     Diabetic Labs (most recent): No results found for: HGBA1C, MICROALBUR    Lipid Panel ( most recent) Lipid Panel  No results found for: CHOL, TRIG, HDL, CHOLHDL, VLDL, LDLCALC, LDLDIRECT, LABVLDL     Lab Results  Component Value Date   TSH 3.680 06/14/2024   TSH 3.280 02/24/2024   TSH 6.360 (H) 11/19/2023   TSH 4.360 08/01/2023   TSH 4.260 02/26/2023   TSH 4.510 (H) 01/01/2023   TSH 7.45 (A) 09/20/2022   FREET4  1.68 06/14/2024   FREET4 1.49 02/24/2024   FREET4 1.37 11/19/2023   FREET4 1.48 08/01/2023   FREET4 1.39 02/26/2023   FREET4 1.25 01/01/2023     Latest Reference Range & Units 02/26/23 10:22 08/01/23 11:16 11/19/23 09:48 02/24/24 10:47 06/14/24 10:12  TSH 0.450 - 4.500 uIU/mL 4.260 4.360 6.360 (H) 3.280 3.680  T4,Free(Direct) 0.82 - 1.77 ng/dL 8.60 8.51 8.62 8.50 8.31  (H): Data is abnormally high  Assessment & Plan:   ASSESSMENT / PLAN:  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    His previsit TFTs are consistent with appropriate hormone replacement.  He is advised to continue Levothyroxine  137 mcg po daily before breakfast.  - We discussed about correct intake of levothyroxine , at fasting, with water, separated by at least 30 minutes from breakfast, and separated by more than 4 hours from calcium, iron, multivitamins, acid reflux medications (PPIs). -Patient is made aware of the fact that thyroid  hormone replacement is needed for life, dose to be adjusted by periodic monitoring of thyroid  function tests.   -Due to absence of clinical goiter, no need for thyroid  ultrasound.  I did send in 7 day course of Keflex  for suspected tissue infection of left great toe.  He can also continue supportive care with epsom salt soaks as well.  I spent  14  minutes in the care of the patient today including review of labs from Thyroid  Function, CMP, and other relevant labs ; imaging/biopsy records (current and previous including abstractions from other facilities);  face-to-face time discussing  his lab results and symptoms, medications doses, his options of short and long term treatment based on the latest standards of care / guidelines;   and documenting the encounter.  Brian Chavez  participated in the discussions, expressed understanding, and voiced agreement with the above plans.  All questions were answered to his satisfaction. he is encouraged to contact clinic should he have any questions or concerns prior to his return visit.   FOLLOW UP PLAN:  Return in about 4 months (around 10/22/2024) for Thyroid  follow up, Previsit labs.  Benton Rio, [facility] [address] [place], [place] [address] Phone: [phone] Fax: [phone]  06/21/2024, 3:20 PM

## 2024-10-13 LAB — TSH: TSH: 3.22 u[IU]/mL (ref 0.450–4.500)

## 2024-10-13 LAB — T4, FREE: Free T4: 1.59 ng/dL (ref 0.82–1.77)

## 2024-10-22 ENCOUNTER — Ambulatory Visit: Admitting: Nurse Practitioner

## 2024-10-22 ENCOUNTER — Encounter: Payer: Self-pay | Admitting: Nurse Practitioner

## 2024-10-22 VITALS — BP 124/84 | HR 68 | Ht 74.0 in | Wt 357.6 lb

## 2024-10-22 DIAGNOSIS — E063 Autoimmune thyroiditis: Secondary | ICD-10-CM

## 2024-10-22 MED ORDER — LEVOTHYROXINE SODIUM 137 MCG PO TABS: 137.0000 ug | ORAL_TABLET | Freq: Every day | ORAL | 3 refills | Status: AC

## 2024-10-22 NOTE — Patient Instructions (Signed)

## 2024-10-22 NOTE — Progress Notes (Signed)
 Endocrinology Follow Up Note                                         10/22/2024, 10:48 AM  Subjective:   Subjective    Brian Chavez is a 33 y.o.-year-old male patient being seen in follow up after being seen in consultation for hypothyroidism referred by Joshua Rayfield RAMAN, NP.   Past Medical History:  Diagnosis Date   Kidney stones    Thyroid  disease     History reviewed. No pertinent surgical history.  Social History   Socioeconomic History   Marital status: Married    Spouse name: Not on file   Number of children: Not on file   Years of education: Not on file   Highest education level: Not on file  Occupational History   Not on file  Tobacco Use   Smoking status: Never   Smokeless tobacco: Former    Types: Chew  Substance and Sexual Activity   Alcohol use: Yes    Comment: occasional    Drug use: No   Sexual activity: Not on file  Other Topics Concern   Not on file  Social History Narrative   Not on file   Social Drivers of Health   Financial Resource Strain: Not on file  Food Insecurity: Not on file  Transportation Needs: Not on file  Physical Activity: Not on file  Stress: Not on file  Social Connections: Not on file    History reviewed. No pertinent family history.  Outpatient Encounter Medications as of 10/22/2024  Medication Sig   amoxicillin  (AMOXIL ) 500 MG capsule Take 1,000 mg by mouth 2 (two) times daily.   fluticasone (FLONASE) 50 MCG/ACT nasal spray Place 1 spray into both nostrils daily. Patient states that uses twice a day   Loratadine (CLARITIN) 10 MG CAPS Take 10 mg by mouth daily.   [DISCONTINUED] levothyroxine  (SYNTHROID ) 137 MCG tablet Take 1 tablet (137 mcg total) by mouth daily before breakfast.   levothyroxine  (SYNTHROID ) 137 MCG tablet Take 1 tablet (137 mcg total) by mouth daily before breakfast.   No facility-administered encounter medications on file as of  10/22/2024.    ALLERGIES: No Known Allergies VACCINATION STATUS: Immunization History  Administered Date(s) Administered   Moderna Sars-Covid-2 Vaccination 01/03/2021, 02/03/2021     HPI   Brian Chavez is a patient with the above medical history. he was diagnosed with hypothyroidism at approximate age of 30 years, which required subsequent initiation of thyroid  hormone replacement therapy. he was given various doses of Levothyroxine , currently on 137 micrograms:  Taking it daily on empty stomach with water, separated by >30 minutes before breakfast and other medications, and by at least 4 hours from calcium, iron, PPIs, multivitamins .  I reviewed patient's thyroid  tests:  Lab Results  Component Value Date   TSH 3.220 10/12/2024   TSH 3.680 06/14/2024   TSH 3.280 02/24/2024   TSH 6.360 (H) 11/19/2023   TSH 4.360 08/01/2023   TSH 4.260 02/26/2023  TSH 4.510 (H) 01/01/2023   TSH 7.45 (A) 09/20/2022   FREET4 1.59 10/12/2024   FREET4 1.68 06/14/2024   FREET4 1.49 02/24/2024   FREET4 1.37 11/19/2023   FREET4 1.48 08/01/2023   FREET4 1.39 02/26/2023   FREET4 1.25 01/01/2023     Pt denies feeling nodules in neck, hoarseness, dysphagia/odynophagia, SOB with lying down.  he does have family history of thyroid  disorders in his mom (unsure of whether hypo or hyper).  No family history of thyroid  cancer.  No history of radiation therapy to head or neck.  No recent use of iodine supplements.  Denies use of Biotin containing supplements.     Review of systems  Constitutional: + decreasing body weight,  current Body mass index is 45.91 kg/m. , no fatigue, no subjective hyperthermia, + subjective hypothermia Eyes: no blurry vision, no xerophthalmia ENT: no sore throat, no nodules palpated in throat, no dysphagia/odynophagia, no hoarseness Cardiovascular: no chest pain, no shortness of breath, no palpitations, no leg swelling Respiratory: no cough, no shortness of  breath Gastrointestinal: no nausea/vomiting/diarrhea Musculoskeletal: no muscle/joint aches Skin: no rashes, no hyperemia Neurological: no tremors, no numbness, no tingling, no dizziness Psychiatric: no depression, no anxiety   Objective:   Objective     BP 124/84 (BP Location: Left Arm, Patient Position: Sitting, Cuff Size: Large)   Pulse 68   Ht 6' 2 (1.88 m)   Wt (!) 357 lb 9.6 oz (162.2 kg)   BMI 45.91 kg/m  Wt Readings from Last 3 Encounters:  10/22/24 (!) 357 lb 9.6 oz (162.2 kg)  06/21/24 (!) 360 lb (163.3 kg)  03/02/24 (!) 371 lb 12.8 oz (168.6 kg)    BP Readings from Last 3 Encounters:  10/22/24 124/84  06/21/24 118/88  03/02/24 116/86       Physical Exam- Limited  Constitutional:  Body mass index is 45.91 kg/m. , not in acute distress, normal state of mind Eyes:  EOMI, no exophthalmos Musculoskeletal: no gross deformities, strength intact in all four extremities, no gross restriction of joint movements Skin:  no rashes, no hyperemia Neurological: no tremor with outstretched hands   CMP ( most recent) CMP     Component Value Date/Time   NA 138 08/11/2018 1218   K 4.0 08/11/2018 1218   CL 103 08/11/2018 1218   CO2 24 08/11/2018 1218   GLUCOSE 97 08/11/2018 1218   BUN 11 08/11/2018 1218   CREATININE 0.91 08/11/2018 1218   CALCIUM 9.2 08/11/2018 1218   PROT 7.5 08/11/2018 1218   ALBUMIN 4.3 08/11/2018 1218   AST 20 08/11/2018 1218   ALT 31 08/11/2018 1218   ALKPHOS 35 (L) 08/11/2018 1218   BILITOT 1.3 (H) 08/11/2018 1218   GFRNONAA >60 08/11/2018 1218   GFRAA >60 08/11/2018 1218     Diabetic Labs (most recent): No results found for: HGBA1C, MICROALBUR   Lipid Panel ( most recent) Lipid Panel  No results found for: CHOL, TRIG, HDL, CHOLHDL, VLDL, LDLCALC, LDLDIRECT, LABVLDL     Lab Results  Component Value Date   TSH 3.220 10/12/2024   TSH 3.680 06/14/2024   TSH 3.280 02/24/2024   TSH 6.360 (H) 11/19/2023   TSH  4.360 08/01/2023   TSH 4.260 02/26/2023   TSH 4.510 (H) 01/01/2023   TSH 7.45 (A) 09/20/2022   FREET4 1.59 10/12/2024   FREET4 1.68 06/14/2024   FREET4 1.49 02/24/2024   FREET4 1.37 11/19/2023   FREET4 1.48 08/01/2023   FREET4 1.39 02/26/2023   FREET4 1.25  01/01/2023     Latest Reference Range & Units 11/19/23 09:48 02/24/24 10:47 06/14/24 10:12 10/12/24 10:19  TSH 0.450 - 4.500 uIU/mL 6.360 (H) 3.280 3.680 3.220  T4,Free(Direct) 0.82 - 1.77 ng/dL 8.62 8.50 8.31 8.40  (H): Data is abnormally high  Assessment & Plan:   ASSESSMENT / PLAN:  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    His previsit TFTs are consistent with appropriate hormone replacement.  He is advised to continue Levothyroxine  137 mcg po daily before breakfast.  Will recheck TFTs prior to next visit and adjust dose accordingly.   - The correct intake of thyroid  hormone (Levothyroxine , Synthroid ), is on empty stomach first thing in the morning, with water, separated by at least 30 minutes from breakfast and other medications,  and separated by more than 4 hours from calcium, iron, multivitamins, acid reflux medications (PPIs).  - This medication is a life-long medication and will be needed to correct thyroid  hormone imbalances for the rest of your life.  The dose may change from time to time, based on thyroid  blood work.  - It is extremely important to be consistent taking this medication, near the same time each morning.  -AVOID TAKING PRODUCTS CONTAINING BIOTIN (commonly found in Hair, Skin, Nails vitamins) AS IT INTERFERES WITH THE VALIDITY OF THYROID  FUNCTION BLOOD TESTS.   -Due to absence of clinical goiter, no need for thyroid  ultrasound.     I spent  28  minutes in the care of the patient today including review of labs from Thyroid  Function, CMP, and other relevant labs ; imaging/biopsy records (current and previous including abstractions  from other facilities); face-to-face time discussing  his lab results and symptoms, medications doses, his options of short and long term treatment based on the latest standards of care / guidelines;   and documenting the encounter.  Donnice ONEIDA Gelineau  participated in the discussions, expressed understanding, and voiced agreement with the above plans.  All questions were answered to his satisfaction. he is encouraged to contact clinic should he have any questions or concerns prior to his return visit.   FOLLOW UP PLAN:  Return in about 6 months (around 04/21/2025) for Thyroid  follow up, Previsit labs.  Benton Rio, [facility] [address] [place], [place] [address] Phone: [phone] Fax: [phone]  10/22/2024, 10:48 AM

## 2024-11-08 ENCOUNTER — Other Ambulatory Visit: Payer: Self-pay

## 2024-11-08 ENCOUNTER — Ambulatory Visit (HOSPITAL_COMMUNITY): Payer: Self-pay | Admitting: Physician Assistant

## 2024-11-08 ENCOUNTER — Ambulatory Visit (INDEPENDENT_AMBULATORY_CARE_PROVIDER_SITE_OTHER)

## 2024-11-08 ENCOUNTER — Ambulatory Visit (HOSPITAL_COMMUNITY)
Admission: EM | Admit: 2024-11-08 | Discharge: 2024-11-08 | Disposition: A | Discharge status: Home / Self Care | Attending: Physician Assistant | Admitting: Physician Assistant

## 2024-11-08 ENCOUNTER — Encounter (HOSPITAL_COMMUNITY): Payer: Self-pay | Admitting: Emergency Medicine

## 2024-11-08 DIAGNOSIS — M5441 Lumbago with sciatica, right side: Secondary | ICD-10-CM

## 2024-11-08 MED ORDER — DEXAMETHASONE SOD PHOSPHATE PF 10 MG/ML IJ SOLN
10.0000 mg | Freq: Once | INTRAMUSCULAR | Status: AC
  Administered 2024-11-08: 10 mg via INTRAMUSCULAR

## 2024-11-08 MED ORDER — METHOCARBAMOL 500 MG PO TABS: 500.0000 mg | ORAL_TABLET | Freq: Two times a day (BID) | ORAL | 0 refills | Status: AC

## 2024-11-08 MED ORDER — KETOROLAC TROMETHAMINE 30 MG/ML IJ SOLN
INTRAMUSCULAR | Status: AC
  Filled 2024-11-08: qty 1

## 2024-11-08 MED ORDER — LIDOCAINE 5 % EX PTCH: 1.0000 | MEDICATED_PATCH | CUTANEOUS | 0 refills | Status: AC

## 2024-11-08 MED ORDER — KETOROLAC TROMETHAMINE 30 MG/ML IJ SOLN
30.0000 mg | Freq: Once | INTRAMUSCULAR | Status: AC
  Administered 2024-11-08: 30 mg via INTRAMUSCULAR

## 2024-11-08 NOTE — ED Triage Notes (Signed)
 Reports this episode of back pain started yesterday around 2 pm.  Patient was helping wife move christmas items.  Patient has a history of back issues.  Patient had to leave work today to get this checked out.  Patient has taken ibuprofen.  Reports this is not helping.  Pain is across lower back.  Patient has pain in both legs.  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  Currently, toes are not numb.

## 2024-11-08 NOTE — ED Provider Notes (Addendum)
 [facility]    CSN: [id number] Arrival date & time: 11/08/24  1211      History   Chief Complaint Chief Complaint  Patient presents with   Back Pain    HPI Brian Chavez is a 33 y.o. male.   Patient presents today with a several day history of lower back pain.  Reports that he was helping his wife move big totes containing Christmas decorations and soon after developed lower back pain.  He reports that pain is rated 5 at rest but increases significantly with palpation or certain movements, localized to his lower back with radiation into his right leg, no alleviating factors identified.  He reports that earlier today he had some numbness in his toes but this is since resolved.  He denies any previous injury or surgery involving his back but does have a history of intermittent lower back pain with similar presentation.  He denies any personal history of malignancy, diabetes, heart disease.  He did take ibuprofen with last dose earlier today at around 9 AM but has been ineffective in managing his pain.  He had to leave work as a result of his symptoms.  He denies any bowel/bladder incontinence, lower extremity weakness, saddle anesthesia.  He is having difficulty with daily activities as he drives a truck for living and the pain is making this more difficult.    Past Medical History:  Diagnosis Date   Kidney stones    Thyroid  disease     There are no active problems to display for this patient.   History reviewed. No pertinent surgical history.     Home Medications    Prior to Admission medications   Medication Sig Start Date End Date Taking? Authorizing Provider  lidocaine  (LIDODERM ) 5 % Place 1 patch onto the skin daily. Remove & Discard patch within 12 hours or as directed by MD 11/08/24  Yes Vanilla Heatherington K, PA-C  methocarbamol  (ROBAXIN ) 500 MG tablet Take 1 tablet (500 mg total) by mouth 2 (two) times daily. 11/08/24  Yes Margurete Guaman K, PA-C  fluticasone  (FLONASE) 50 MCG/ACT nasal spray Place 1 spray into both nostrils daily. Patient states that uses twice a day    [provider]  levothyroxine  (SYNTHROID ) 137 MCG tablet Take 1 tablet (137 mcg total) by mouth daily before breakfast. 10/22/24   Therisa Benton PARAS, NP  Loratadine (CLARITIN) 10 MG CAPS Take 10 mg by mouth daily.    [provider]    Family History Family History  Problem Relation Age of Onset   Diabetes Mother    Diabetes Father     Social History Social History   Tobacco Use   Smoking status: Never   Smokeless tobacco: Former    Types: Engineer, Drilling   Vaping status: Never Used  Substance Use Topics   Alcohol use: Yes    Comment: occasional    Drug use: No     Allergies   Patient has no known allergies.   Review of Systems Review of Systems  Constitutional:  Positive for activity change. Negative for appetite change, fatigue and fever.  Gastrointestinal:  Negative for abdominal pain, diarrhea, nausea and vomiting.  Genitourinary:  Negative for difficulty urinating and enuresis.  Musculoskeletal:  Positive for back pain. Negative for arthralgias and myalgias.  Skin:  Negative for rash and wound.  Neurological:  Negative for weakness and numbness (numbness in right toes- since resolved).     Physical Exam Triage Vital  Signs ED Triage Vitals  Encounter Vitals Group     BP 11/08/24 1334 132/85     Girls Systolic BP Percentile --      Girls Diastolic BP Percentile --      Boys Systolic BP Percentile --      Boys Diastolic BP Percentile --      Pulse Rate 11/08/24 1334 66     Resp 11/08/24 1334 20     Temp 11/08/24 1334 97.9 F (36.6 C)     Temp Source 11/08/24 1334 Oral     SpO2 11/08/24 1334 96 %     Weight --      Height --      Head Circumference --      Peak Flow --      Pain Score 11/08/24 1330 5     Pain Loc --      Pain Education --      Exclude from Growth Chart --    No data found.  Updated Vital Signs BP  132/85 (BP Location: Right Arm) Comment (BP Location): large cuff  Pulse 66   Temp 97.9 F (36.6 C) (Oral)   Resp 20   SpO2 96%   Visual Acuity Right Eye Distance:   Left Eye Distance:   Bilateral Distance:    Right Eye Near:   Left Eye Near:    Bilateral Near:     Physical Exam Vitals reviewed.  Constitutional:      General: He is awake.     Appearance: Normal appearance. He is well-developed. He is not ill-appearing.     Comments: Very pleasant male appears stated age in no acute distress sitting comfortably in exam room  HENT:     Head: Normocephalic and atraumatic.  Cardiovascular:     Rate and Rhythm: Normal rate and regular rhythm.     Heart sounds: Normal heart sounds, S1 normal and S2 normal. No murmur heard. Pulmonary:     Effort: Pulmonary effort is normal.     Breath sounds: Normal breath sounds. No stridor. No wheezing, rhonchi or rales.     Comments: Clear to auscultation bilaterally Abdominal:     Palpations: Abdomen is soft.     Tenderness: There is no abdominal tenderness. There is no right CVA tenderness, left CVA tenderness, guarding or rebound.  Musculoskeletal:     Cervical back: No tenderness or bony tenderness.     Thoracic back: No tenderness or bony tenderness.     Lumbar back: Spasms, tenderness and bony tenderness present. Normal range of motion. Negative right straight leg raise test and negative left straight leg raise test.       Back:     Comments: Back: Pain with percussion of lumbar vertebrae from approximately L3-L5 without deformity or step-off noted.  Strength 5/5 bilateral lower extremities.  There is palpation of bilateral lumbar paraspinal muscles with associated spasm.  Negative straight leg raise bilaterally.  Negative FABER bilaterally.  Neurological:     Mental Status: He is alert.  Psychiatric:        Behavior: Behavior is cooperative.      UC Treatments / Results  Labs (all labs ordered are listed, but only abnormal  results are displayed) Labs Reviewed - No data to display  EKG   Radiology No results found.  Procedures Procedures (including critical care time)  Medications Ordered in UC Medications  ketorolac  (TORADOL ) 30 MG/ML injection 30 mg (30 mg Intramuscular Given 11/08/24 1423)  dexamethasone  (DECADRON ) injection  10 mg (10 mg Intramuscular Given 11/08/24 1424)    Initial Impression / Assessment and Plan / UC Course  I have reviewed the triage vital signs and the nursing notes.  Pertinent labs & imaging results that were available during my care of the patient were reviewed by me and considered in my medical decision making (see chart for details).     Patient is well appearing, afebrile, nontoxic, nontachycardic.  Patient denies any alarm symptoms that warrant emergent evaluation or imaging.  He did have bony tenderness and so x-ray was obtained that showed degenerative changes including loss of disc height in the lower thoracic upper lumbar spine.  At the time of discharge reporting radiologist over read we will contact him if this differs and changes our treatment plan.  Patient requested a copy of these films and so a CD was burned by radiology tech and provided during his visit.  We discussed that given these abnormal findings and his severe pain I have recommended he follow-up with orthopedics as they can help arrange additional treatment including referral to PT and potentially MRI if appropriate.  He was given their contact information with instruction to call to schedule appointment.  He was given injection of Toradol  as well as Decadron  in clinic.  Discussed that he is to avoid NSAIDs for the next 24 hours given Toradol  injection.  He was given prescription for lidocaine  patches for additional symptom relief as well as methocarbamol .  We discussed that methocarbamol  is sedating and he should not drive or drink alcohol with taking it.  We discussed that if he has any worsening or changing  symptoms including bowel/bladder incontinence, lower extremity weakness, saddle anesthesia needs to be seen emergently.  Strict return precautions given.  Excuse note provided.  Final Clinical Impressions(s) / UC Diagnoses   Final diagnoses:  Acute midline low back pain with right-sided sciatica     Discharge Instructions      I am concerned that you have some disc height loss and other degeneration in your back which is contributing to your pain.  I would like you to follow-up with an orthopedic provider to consider additional evaluation including potentially an MRI that I do not have access to an urgent care.  Please call them to schedule an appointment.  We gave an injection of Toradol  so please do not take NSAIDs (aspirin, ibuprofen/Advil, naproxen/Aleve) for 24 hours.  You can use Tylenol /acetaminophen  as needed.  Take Robaxin  up to twice a day.  This will make you sleepy so do not drive or drink alcohol with taking it.  Apply lidocaine  patch during the day and then remove this at night; use only 1 patch per 24 hours.  If you have any worsening symptoms including going to the bathroom yourself without noticing it, weakness in your legs, numbness or tingling in your legs you need to be seen immediately.     ED Prescriptions     Medication Sig Dispense Auth. Provider   methocarbamol  (ROBAXIN ) 500 MG tablet Take 1 tablet (500 mg total) by mouth 2 (two) times daily. 20 tablet Hillary Schwegler K, PA-C   lidocaine  (LIDODERM ) 5 % Place 1 patch onto the skin daily. Remove & Discard patch within 12 hours or as directed by MD 14 patch Ferdinando Lodge K, PA-C      PDMP not reviewed this encounter.   Sherrell Rocky POUR, PA-C 11/08/24 1458    Rashaud Ybarbo K, PA-C 11/08/24 1500

## 2024-11-08 NOTE — Discharge Instructions (Signed)
 I am concerned that you have some disc height loss and other degeneration in your back which is contributing to your pain.  I would like you to follow-up with an orthopedic provider to consider additional evaluation including potentially an MRI that I do not have access to an urgent care.  Please call them to schedule an appointment.  We gave an injection of Toradol  so please do not take NSAIDs (aspirin, ibuprofen/Advil, naproxen/Aleve) for 24 hours.  You can use Tylenol /acetaminophen  as needed.  Take Robaxin  up to twice a day.  This will make you sleepy so do not drive or drink alcohol with taking it.  Apply lidocaine  patch during the day and then remove this at night; use only 1 patch per 24 hours.  If you have any worsening symptoms including going to the bathroom yourself without noticing it, weakness in your legs, numbness or tingling in your legs you need to be seen immediately.

## 2025-04-22 ENCOUNTER — Ambulatory Visit: Admitting: Nurse Practitioner
